# Patient Record
Sex: Female | Born: 1960 | Race: White | Hispanic: No | Marital: Married | Smoking: Never smoker
Health system: Southern US, Community
[De-identification: ages and names within clinical notes are randomized; demographics above are authoritative.]

## PROBLEM LIST (undated history)

## (undated) DIAGNOSIS — K219 Gastro-esophageal reflux disease without esophagitis: Secondary | ICD-10-CM

## (undated) DIAGNOSIS — K8050 Calculus of bile duct without cholangitis or cholecystitis without obstruction: Secondary | ICD-10-CM

## (undated) DIAGNOSIS — T7840XA Allergy, unspecified, initial encounter: Secondary | ICD-10-CM

## (undated) HISTORY — DX: Gastro-esophageal reflux disease without esophagitis: K21.9

## (undated) HISTORY — DX: Allergy, unspecified, initial encounter: T78.40XA

---

## 1993-09-11 HISTORY — PX: TMJ ARTHROPLASTY: SHX1066

## 2016-02-22 ENCOUNTER — Encounter (INDEPENDENT_AMBULATORY_CARE_PROVIDER_SITE_OTHER): Payer: Self-pay

## 2016-02-22 ENCOUNTER — Encounter: Payer: Self-pay | Admitting: Family Medicine

## 2016-02-22 ENCOUNTER — Ambulatory Visit (INDEPENDENT_AMBULATORY_CARE_PROVIDER_SITE_OTHER): Payer: BLUE CROSS/BLUE SHIELD | Admitting: Family Medicine

## 2016-02-22 VITALS — BP 98/66 | HR 73 | Temp 97.1°F | Ht 65.0 in | Wt 100.0 lb

## 2016-02-22 DIAGNOSIS — R0689 Other abnormalities of breathing: Secondary | ICD-10-CM

## 2016-02-22 DIAGNOSIS — J3089 Other allergic rhinitis: Secondary | ICD-10-CM | POA: Diagnosis not present

## 2016-02-22 DIAGNOSIS — H8110 Benign paroxysmal vertigo, unspecified ear: Secondary | ICD-10-CM

## 2016-02-22 DIAGNOSIS — R0600 Dyspnea, unspecified: Secondary | ICD-10-CM

## 2016-02-22 DIAGNOSIS — R42 Dizziness and giddiness: Secondary | ICD-10-CM

## 2016-02-22 LAB — MICROSCOPIC EXAMINATION

## 2016-02-22 LAB — URINALYSIS, COMPLETE
BILIRUBIN UA: NEGATIVE
GLUCOSE, UA: NEGATIVE
Nitrite, UA: NEGATIVE
Protein, UA: NEGATIVE
Specific Gravity, UA: >1.03 — ABNORMAL HIGH (ref 1.005–1.030)
UUROB: 0.2 mg/dL (ref 0.2–1.0)
pH, UA: 5.5 (ref 5.0–7.5)

## 2016-02-22 NOTE — Progress Notes (Signed)
Subjective:  Patient ID: Joyce Hopkins, female    DOB: Jan 28, 1961  Age: 55 y.o. MRN: [id number]  CC: Establish Care   HPI Joyce Hopkins presents for Concerns about recent episodes of vertigo. Several days ago she rolled over in bed during the night and the room started spinning. It only lasts a few moments then things were back to normal and she went back to sleep. Unfortunately a few days ago it happened again. Then 2 days ago she noticed an episode through the day. She turned her head suddenly upward and then backed downward and the room started spinning and she was off balance and unsteady for several minutes. After that she felt somewhat off balance the rest of the day like she had to step very carefully with every movement for the rest of that day. There was some mild nausea associated as well. She has no previous history of vertigo. She does use hyoscyamine periodically for irritable bowel symptoms. She has been prescribed a small amount of alprazolam periodically for anxiety when she is overwhelmed.  GAD 7 : Generalized Anxiety Score 02/22/2016  Nervous, Anxious, on Edge 1  Control/stop worrying 1  Worry too much - different things 1  Trouble relaxing 2  Restless 1  Easily annoyed or irritable 1  Afraid - awful might happen 0  Total GAD 7 Score 7  Anxiety Difficulty Not difficult at all     Depression screen [facility] 2/9 02/22/2016  Decreased Interest 0  Down, Depressed, Hopeless 0  PHQ - 2 Score 0    Patient is concerned that allergy may be playing a role in this and that she was mowing the lawn a few days ago and noticed that her eyes started watering and itching. This was the day after the most recent episode. She has multiple on frequently before that with no similar reaction. However, she has a history of childhood asthma and has used an inhaler with some relief in the past as an adult. History Joyce Hopkins has a past medical history of Allergy.   She has past surgical history that includes  TMJ Arthroplasty.   Her family history includes Heart disease in her father.She reports that she has never smoked. She does not have any smokeless tobacco history on file. She reports that she drinks alcohol. She reports that she does not use illicit drugs.  No current outpatient prescriptions on file prior to visit.   No current facility-administered medications on file prior to visit.    ROS Review of Systems  Constitutional: Negative for fever, activity change and appetite change.  HENT: Negative for congestion, rhinorrhea and sore throat.   Eyes: Negative for visual disturbance.  Respiratory: Negative for cough and shortness of breath.   Cardiovascular: Negative for chest pain and palpitations.  Gastrointestinal: Negative for nausea, abdominal pain and diarrhea.  Genitourinary: Negative for dysuria.  Musculoskeletal: Negative for myalgias and arthralgias.    Objective:  BP 98/66 mmHg  Pulse 73  Temp(Src) 97.1 F (36.2 C) (Oral)  Ht '5\' 5"'  (1.651 m)  Wt 100 lb (45.36 kg)  BMI 16.64 kg/m2  SpO2 100%  Physical Exam  Constitutional: She is oriented to person, place, and time. She appears well-developed and well-nourished. No distress.  HENT:  Head: Normocephalic and atraumatic.  Right Ear: External ear normal.  Left Ear: External ear normal.  Nose: Nose normal.  Mouth/Throat: Oropharynx is clear and moist.  Eyes: Conjunctivae and EOM are normal. Pupils are equal, round, and reactive to light.  Neck: Normal range of motion. Neck supple. No thyromegaly present.  Cardiovascular: Normal rate, regular rhythm and normal heart sounds.   No murmur heard. Pulmonary/Chest: Effort normal and breath sounds normal. No respiratory distress. She has no wheezes. She has no rales.  Abdominal: Soft. Bowel sounds are normal. She exhibits no distension. There is no tenderness.  Lymphadenopathy:    She has no cervical adenopathy.  Neurological: She is alert and oriented to person, place, and  time. She has normal reflexes.  Skin: Skin is warm and dry.  Psychiatric: She has a normal mood and affect. Her behavior is normal. Judgment and thought content normal.    Assessment & Plan:   Joyce Hopkins was seen today for establish care.  Diagnoses and all orders for this visit:  Dyspnea and respiratory abnormality -     CBC with Differential/Platelet -     CMP14+EGFR -     Lipid panel -     PR BREATHING CAPACITY TEST -     TSH + free T4 -     Urinalysis, Complete -     Sedimentation rate  Other allergic rhinitis -     CBC with Differential/Platelet -     Sedimentation rate  Benign paroxysmal positional vertigo, unspecified laterality -     CBC with Differential/Platelet -     CMP14+EGFR -     Lipid panel -     TSH + free T4 -     Urinalysis, Complete -     Sedimentation rate  Vertigo -     Ambulatory referral to Neurology  Other orders -     Microscopic Examination   I am having Joyce Hopkins maintain her ALPRAZolam, hyoscyamine, and albuterol.  Meds ordered this encounter  Medications  . ALPRAZolam (XANAX) 0.5 MG tablet    Sig: Take 0.5 mg by mouth at bedtime as needed.   . hyoscyamine (LEVSIN, ANASPAZ) 0.125 MG tablet    Sig: Take 0.125 mg by mouth every 6 (six) hours as needed.   Marland Kitchen albuterol (PROAIR HFA) 108 (90 Base) MCG/ACT inhaler    Sig: Inhale 2 puffs into the lungs every 6 (six) hours as needed.    Pulmonary function reveals FEV1 to FVC ratio of 69% which is at 80% of expected.  Follow-up: Return in about 6 months (around 08/23/2016).  Joyce Hopkins, M.D.

## 2016-02-23 LAB — CBC WITH DIFFERENTIAL/PLATELET
BASOS ABS: 0 10*3/uL (ref 0.0–0.2)
Basos: 1 %
EOS (ABSOLUTE): 0.4 10*3/uL (ref 0.0–0.4)
Eos: 6 %
Hematocrit: 42 % (ref 34.0–46.6)
Hemoglobin: 13.3 g/dL (ref 11.1–15.9)
IMMATURE GRANS (ABS): 0 10*3/uL (ref 0.0–0.1)
Immature Granulocytes: 0 %
LYMPHS ABS: 2.2 10*3/uL (ref 0.7–3.1)
LYMPHS: 33 %
MCH: 28.8 pg (ref 26.6–33.0)
MCHC: 31.7 g/dL (ref 31.5–35.7)
MCV: 91 fL (ref 79–97)
Monocytes Absolute: 0.6 10*3/uL (ref 0.1–0.9)
Monocytes: 8 %
NEUTROS ABS: 3.6 10*3/uL (ref 1.4–7.0)
Neutrophils: 52 %
PLATELETS: 227 10*3/uL (ref 150–379)
RBC: 4.62 x10E6/uL (ref 3.77–5.28)
RDW: 13.6 % (ref 12.3–15.4)
WBC: 6.7 10*3/uL (ref 3.4–10.8)

## 2016-02-23 LAB — CMP14+EGFR
A/G RATIO: 1.6 (ref 1.2–2.2)
ALBUMIN: 4.7 g/dL (ref 3.5–5.5)
ALT: 26 IU/L (ref 0–32)
AST: 24 IU/L (ref 0–40)
Alkaline Phosphatase: 85 IU/L (ref 39–117)
BUN / CREAT RATIO: 19 (ref 9–23)
BUN: 14 mg/dL (ref 6–24)
Bilirubin Total: <0.2 mg/dL (ref 0.0–1.2)
CO2: 25 mmol/L (ref 18–29)
Calcium: 9.7 mg/dL (ref 8.7–10.2)
Chloride: 101 mmol/L (ref 96–106)
Creatinine, Ser: 0.72 mg/dL (ref 0.57–1.00)
GFR calc non Af Amer: 95 mL/min/{1.73_m2} (ref >59)
GFR, EST AFRICAN AMERICAN: 109 mL/min/{1.73_m2} (ref >59)
GLUCOSE: 86 mg/dL (ref 65–99)
Globulin, Total: 3 g/dL (ref 1.5–4.5)
POTASSIUM: 4.4 mmol/L (ref 3.5–5.2)
Sodium: 141 mmol/L (ref 134–144)
TOTAL PROTEIN: 7.7 g/dL (ref 6.0–8.5)

## 2016-02-23 LAB — SEDIMENTATION RATE: SED RATE: 2 mm/h (ref 0–40)

## 2016-02-23 LAB — LIPID PANEL
Chol/HDL Ratio: 2.8 ratio units (ref 0.0–4.4)
Cholesterol, Total: 215 mg/dL — ABNORMAL HIGH (ref 100–199)
HDL: 77 mg/dL (ref >39)
LDL Calculated: 109 mg/dL — ABNORMAL HIGH (ref 0–99)
TRIGLYCERIDES: 145 mg/dL (ref 0–149)
VLDL Cholesterol Cal: 29 mg/dL (ref 5–40)

## 2016-02-23 LAB — TSH+FREE T4
Free T4: 0.96 ng/dL (ref 0.82–1.77)
TSH: 2.31 u[IU]/mL (ref 0.450–4.500)

## 2016-02-28 ENCOUNTER — Telehealth: Payer: Self-pay | Admitting: Family Medicine

## 2016-03-17 ENCOUNTER — Ambulatory Visit (INDEPENDENT_AMBULATORY_CARE_PROVIDER_SITE_OTHER): Payer: BLUE CROSS/BLUE SHIELD | Admitting: Neurology

## 2016-03-17 ENCOUNTER — Encounter: Payer: Self-pay | Admitting: Neurology

## 2016-03-17 VITALS — Ht 64.0 in | Wt 101.0 lb

## 2016-03-17 DIAGNOSIS — H8110 Benign paroxysmal vertigo, unspecified ear: Secondary | ICD-10-CM

## 2016-03-17 DIAGNOSIS — F411 Generalized anxiety disorder: Secondary | ICD-10-CM

## 2016-03-17 NOTE — Progress Notes (Signed)
[facility]    Provider:  Dr Jaynee Eagles Referring Provider: Claretta Fraise, MD Primary Care Physician:  Claretta Fraise, MD  CC:  dizziness  HPI:  Joyce Hopkins is a 55 y.o. female here as a referral from Dr. Livia Snellen for dizziness. Past medical history of anxiety. Dizziness started May 22nd, she rolled over in bed and the room started spinning, no other inciting event or head trauma but she was having allergy problems. . This worsens her anxiety, makes her anxiety worse during episodes. Happened again a week later also rolling over in bed. Brief.originally resolved right away  but more recently the symptoms continued with a "swimmy head" feeling for a few weeks. She was feeling "swimmy headed" and the next day she looked up and had a really bad room spinning episode again, felt nauseated. Laying still made it better. Head movements made it worse. She started taking allegra and it has helped tremendously and she started this on June 19th. She has not had any more incidents since then. All started in the setting of allergy problems. No diabtes, no hld, no htn, no heart disease, no risk factors for stroke. No other focal neurologic deficits or complaints.   Review of Systems: Patient complains of symptoms per HPI as well as the following symptoms: Dizziness, anxiety, spinning sensation, allergies. Pertinent negatives per HPI. All others negative.   Social History   Social History  . Marital Status: Married    Spouse Name: Lake Bells "wes"  . Number of Children: 2  . Years of Education: 16   Occupational History  . Not on file.   Social History Main Topics  . Smoking status: Never Smoker   . Smokeless tobacco: Not on file  . Alcohol Use: 0.0 oz/week    0 Standard drinks or equivalent per week     Comment: rarely  . Drug Use: No  . Sexual Activity: Not on file   Other Topics Concern  . Not on file   Social History Narrative   Lives with husband   Caffeine use: 1 code per  day   Coffee daily (very little)    Family History  Problem Relation Age of Onset  . Heart disease Father   . Stroke Neg Hx   . Migraines Neg Hx   . Neuropathy Neg Hx     Past Medical History  Diagnosis Date  . Allergy     Past Surgical History  Procedure Laterality Date  . Tmj arthroplasty  1995    Current Outpatient Prescriptions  Medication Sig Dispense Refill  . albuterol (PROAIR HFA) 108 (90 Base) MCG/ACT inhaler Inhale 2 puffs into the lungs every 6 (six) hours as needed.     . ALPRAZolam (XANAX) 0.5 MG tablet Take 0.5 mg by mouth at bedtime as needed.     . hyoscyamine (LEVSIN, ANASPAZ) 0.125 MG tablet Take 0.125 mg by mouth every 6 (six) hours as needed.      No current facility-administered medications for this visit.    Allergies as of 03/17/2016 - Review Complete 03/17/2016  Allergen Reaction Noted  . Acetaminophen Anaphylaxis 02/22/2016  . Levofloxacin  02/22/2016    Vitals: Ht 5\' 4"  (1.626 m)  Wt 101 lb (45.813 kg)  BMI 17.33 kg/m2 Last Weight:  Wt Readings from Last 1 Encounters:  03/17/16 101 lb (45.813 kg)   Last Height:   Ht Readings from Last 1 Encounters:  03/17/16 5\' 4"  (1.626 m)   Physical exam: Exam: Gen: NAD, conversant, well nourised,  obese, well groomed                     CV: RRR, no MRG. No Carotid Bruits. No peripheral edema, warm, nontender Eyes: Conjunctivae clear without exudates or hemorrhage  Neuro: Detailed Neurologic Exam  Speech:    Speech is normal; fluent and spontaneous with normal comprehension.  Cognition:    The patient is oriented to person, place, and time;     recent and remote memory intact;     language fluent;     normal attention, concentration,     fund of knowledge Cranial Nerves:    The pupils are equal, round, and reactive to light. The fundi are normal and spontaneous venous pulsations are present. Visual fields are full to finger confrontation. Extraocular movements are intact. Trigeminal  sensation is intact and the muscles of mastication are normal. The face is symmetric. The palate elevates in the midline. Hearing intact. Voice is normal. Shoulder shrug is normal. The tongue has normal motion without fasciculations.   Coordination:    Normal finger to nose and heel to shin. Normal rapid alternating movements.   Gait:    Heel-toe and tandem gait are normal.   Motor Observation:    No asymmetry, no atrophy, and no involuntary movements noted. Tone:    Normal muscle tone.    Posture:    Posture is normal. normal erect    Strength:    Strength is V/V in the upper and lower limbs.      Sensation: intact to LT     Reflex Exam:  DTR's:    Deep tendon reflexes in the upper and lower extremities are normal bilaterally.   Toes:    The toes are downgoing bilaterally.   Clonus:    Clonus is absent.   Diz-Hallpike negative  Assessment/Plan:  55 year old with benign positional peripheral vertigo resolved with antihistamine. Diz-hallpike negative. Patient denies any risk factors for stroke. Had a discussion about BPPV, etiology, ways to treat. If symptoms occur again would refer to vestibular rehab for epley maneuver instructions and other therapy, order MRI of the brain for other etiology and prescribe meclizine. Reassured patient.  BPPV: Continue antihistamine as needed. If BPPV symptoms reoccur, order vestibular therapy for Epley Maneuver instructions and other compensatory therapy Anxiety: If anxiety is a factor, try a Xanax for episodes as this is a great vestibular suppressant for the dizziness/vertigo and also will help for the episodic anxiety.  Sarina Ill, MD  [facility] [address] [place], [place] [address]  Phone [phone] Fax [phone]

## 2016-03-17 NOTE — Patient Instructions (Signed)

## 2016-03-19 ENCOUNTER — Encounter: Payer: Self-pay | Admitting: Neurology

## 2016-03-19 DIAGNOSIS — H8110 Benign paroxysmal vertigo, unspecified ear: Secondary | ICD-10-CM | POA: Insufficient documentation

## 2016-03-19 DIAGNOSIS — F411 Generalized anxiety disorder: Secondary | ICD-10-CM | POA: Insufficient documentation

## 2016-04-04 DIAGNOSIS — H5203 Hypermetropia, bilateral: Secondary | ICD-10-CM | POA: Diagnosis not present

## 2016-04-04 DIAGNOSIS — H524 Presbyopia: Secondary | ICD-10-CM | POA: Diagnosis not present

## 2016-04-04 DIAGNOSIS — H52222 Regular astigmatism, left eye: Secondary | ICD-10-CM | POA: Diagnosis not present

## 2016-06-24 ENCOUNTER — Ambulatory Visit (INDEPENDENT_AMBULATORY_CARE_PROVIDER_SITE_OTHER): Payer: 59

## 2016-06-24 DIAGNOSIS — Z23 Encounter for immunization: Secondary | ICD-10-CM | POA: Diagnosis not present

## 2016-07-10 ENCOUNTER — Other Ambulatory Visit: Payer: Self-pay | Admitting: Family Medicine

## 2016-07-10 NOTE — Telephone Encounter (Signed)
Appointment scheduled for 07/14/16 for followup

## 2016-07-14 ENCOUNTER — Ambulatory Visit: Payer: 59 | Admitting: Family Medicine

## 2016-07-17 ENCOUNTER — Ambulatory Visit (INDEPENDENT_AMBULATORY_CARE_PROVIDER_SITE_OTHER): Payer: 59 | Admitting: Family Medicine

## 2016-07-17 ENCOUNTER — Encounter: Payer: Self-pay | Admitting: Family Medicine

## 2016-07-17 VITALS — BP 106/64 | HR 70 | Temp 97.0°F | Ht 64.0 in | Wt 103.0 lb

## 2016-07-17 DIAGNOSIS — R0689 Other abnormalities of breathing: Secondary | ICD-10-CM

## 2016-07-17 DIAGNOSIS — R0600 Dyspnea, unspecified: Secondary | ICD-10-CM | POA: Diagnosis not present

## 2016-07-17 DIAGNOSIS — J4520 Mild intermittent asthma, uncomplicated: Secondary | ICD-10-CM | POA: Diagnosis not present

## 2016-07-17 DIAGNOSIS — F411 Generalized anxiety disorder: Secondary | ICD-10-CM | POA: Diagnosis not present

## 2016-07-17 MED ORDER — ALPRAZOLAM 0.5 MG PO TABS: 0.5000 mg | ORAL_TABLET | Freq: Every evening | ORAL | 2 refills | Status: DC | PRN

## 2016-07-17 MED ORDER — ALBUTEROL SULFATE HFA 108 (90 BASE) MCG/ACT IN AERS: 2.0000 | INHALATION_SPRAY | Freq: Four times a day (QID) | RESPIRATORY_TRACT | 11 refills | Status: DC | PRN

## 2016-07-17 NOTE — Progress Notes (Signed)
Subjective:  Patient ID: Joyce Hopkins, female    DOB: 03-16-1961  Age: 55 y.o. 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 Symptoms have been under good control recently. She does need refill on her Xanax. Additionally she has some intermittent dyspnea with exertion. She has no inhaler that gives her good relief. Symptoms seem to have occurred a bit more recently. She is having to use the inhaler more. However it is not interfering with her daily routine as a rule.  GAD 7 : Generalized Anxiety Score 07/17/2016 02/22/2016  Nervous, Anxious, on Edge 1 1  Control/stop worrying 0 1  Worry too much - different things 1 1  Trouble relaxing 0 2  Restless 0 1  Easily annoyed or irritable 0 1  Afraid - awful might happen 0 0  Total GAD 7 Score 2 7  Anxiety Difficulty Not difficult at all Not difficult at all     History Joyce Hopkins has a past medical history of Allergy.   She has a past surgical history that includes TMJ Arthroplasty (1995).   Her family history includes Heart disease in her father.She reports that she has never smoked. She does not have any smokeless tobacco history on file. She reports that she drinks alcohol. She reports that she does not use drugs.    Medication List       Accurate as of 07/17/16 11:59 PM. Always use your most recent med list.          albuterol 108 (90 Base) MCG/ACT inhaler Commonly known as:  PROAIR HFA Inhale 2 puffs into the lungs every 6 (six) hours as needed.   ALPRAZolam 0.5 MG tablet Commonly known as:  XANAX Take 1 tablet (0.5 mg total) by mouth at bedtime as needed for anxiety or sleep.   hyoscyamine 0.125 MG tablet Commonly known as:  LEVSIN, ANASPAZ Take 0.125 mg by mouth every 6 (six) hours as needed.        ROS Review of Systems  Constitutional: Negative for activity change, appetite change and fever.  HENT: Negative for congestion, rhinorrhea and  sore throat.   Eyes: Negative for visual disturbance.  Respiratory: Negative for cough and shortness of breath.   Cardiovascular: Negative for chest pain and palpitations.  Gastrointestinal: Negative for abdominal pain, diarrhea and nausea.  Genitourinary: Negative for dysuria.  Musculoskeletal: Negative for arthralgias and myalgias.    Objective:  BP 106/64   Pulse 70   Temp 97 F (36.1 C) (Oral)   Ht 5\' 4"  (1.626 m)   Wt 103 lb (46.7 kg)   BMI 17.68 kg/m   BP Readings from Last 3 Encounters:  07/17/16 106/64  02/22/16 98/66    Wt Readings from Last 3 Encounters:  07/17/16 103 lb (46.7 kg)  03/17/16 101 lb (45.8 kg)  02/22/16 100 lb (45.4 kg)     Physical Exam  Constitutional: She is oriented to person, place, and time. She appears well-developed and well-nourished. No distress.  HENT:  Head: Normocephalic and atraumatic.  Right Ear: External ear normal.  Left Ear: External ear normal.  Nose: Nose normal.  Mouth/Throat: Oropharynx is clear and moist.  Eyes: Conjunctivae and EOM are normal. Pupils are equal, round, and reactive to light.  Neck: Normal range of motion. Neck supple. No thyromegaly present.  Cardiovascular: Normal rate, regular rhythm and normal heart sounds.   No murmur heard. Pulmonary/Chest: Effort normal and breath sounds normal. No  respiratory distress. She has no wheezes. She has no rales.  Abdominal: Soft. Bowel sounds are normal. She exhibits no distension. There is no tenderness.  Lymphadenopathy:    She has no cervical adenopathy.  Neurological: She is alert and oriented to person, place, and time. She has normal reflexes.  Skin: Skin is warm and dry.  Psychiatric: She has a normal mood and affect. Her behavior is normal. Judgment and thought content normal.       Patient was never admitted.  Assessment & Plan:   Crisol was seen today for medication refill.  Diagnoses and all orders for this visit:  Anxiety state  Mild  intermittent asthma without complication  Dyspnea and respiratory abnormality -     albuterol (PROAIR HFA) 108 (90 Base) MCG/ACT inhaler; Inhale 2 puffs into the lungs every 6 (six) hours as needed.  Other orders -     ALPRAZolam (XANAX) 0.5 MG tablet; Take 1 tablet (0.5 mg total) by mouth at bedtime as needed for anxiety or sleep.   I have changed Ms. Osbourne's ALPRAZolam. I am also having her maintain her hyoscyamine and albuterol.  Meds ordered this encounter  Medications  . ALPRAZolam (XANAX) 0.5 MG tablet    Sig: Take 1 tablet (0.5 mg total) by mouth at bedtime as needed for anxiety or sleep.    Dispense:  30 tablet    Refill:  2  . albuterol (PROAIR HFA) 108 (90 Base) MCG/ACT inhaler    Sig: Inhale 2 puffs into the lungs every 6 (six) hours as needed.    Dispense:  1 Inhaler    Refill:  11     Follow-up: Return in about 6 months (around 01/14/2017).  Claretta Fraise, M.D.

## 2016-07-19 ENCOUNTER — Telehealth: Payer: Self-pay | Admitting: Family Medicine

## 2016-08-06 ENCOUNTER — Encounter: Payer: Self-pay | Admitting: Family Medicine

## 2016-08-23 ENCOUNTER — Ambulatory Visit: Payer: BLUE CROSS/BLUE SHIELD | Admitting: Family Medicine

## 2017-01-30 DIAGNOSIS — H04123 Dry eye syndrome of bilateral lacrimal glands: Secondary | ICD-10-CM | POA: Diagnosis not present

## 2017-04-02 ENCOUNTER — Encounter: Payer: Self-pay | Admitting: Family Medicine

## 2017-04-02 ENCOUNTER — Ambulatory Visit (INDEPENDENT_AMBULATORY_CARE_PROVIDER_SITE_OTHER): Payer: 59 | Admitting: Family Medicine

## 2017-04-02 VITALS — BP 89/60 | HR 65 | Temp 97.7°F | Ht 64.0 in | Wt 102.0 lb

## 2017-04-02 DIAGNOSIS — K2100 Gastro-esophageal reflux disease with esophagitis, without bleeding: Secondary | ICD-10-CM

## 2017-04-02 DIAGNOSIS — F5101 Primary insomnia: Secondary | ICD-10-CM

## 2017-04-02 DIAGNOSIS — R0600 Dyspnea, unspecified: Secondary | ICD-10-CM

## 2017-04-02 DIAGNOSIS — F411 Generalized anxiety disorder: Secondary | ICD-10-CM

## 2017-04-02 DIAGNOSIS — R0689 Other abnormalities of breathing: Secondary | ICD-10-CM | POA: Diagnosis not present

## 2017-04-02 MED ORDER — HYOSCYAMINE SULFATE 0.125 MG PO TABS: 0.1250 mg | ORAL_TABLET | Freq: Four times a day (QID) | ORAL | 5 refills | Status: DC | PRN

## 2017-04-02 MED ORDER — ALPRAZOLAM 0.5 MG PO TABS: 0.5000 mg | ORAL_TABLET | Freq: Every evening | ORAL | 2 refills | Status: DC | PRN

## 2017-04-02 MED ORDER — ALBUTEROL SULFATE HFA 108 (90 BASE) MCG/ACT IN AERS: 2.0000 | INHALATION_SPRAY | Freq: Four times a day (QID) | RESPIRATORY_TRACT | 11 refills | Status: DC | PRN

## 2017-04-02 MED ORDER — PANTOPRAZOLE SODIUM 40 MG PO TBEC: 40.0000 mg | DELAYED_RELEASE_TABLET | Freq: Every day | ORAL | 11 refills | Status: DC

## 2017-04-02 MED ORDER — TRAZODONE HCL 150 MG PO TABS: ORAL_TABLET | ORAL | 5 refills | Status: DC

## 2017-04-02 NOTE — Patient Instructions (Signed)

## 2017-04-02 NOTE — Progress Notes (Signed)
Subjective:  Patient ID: Joyce Hopkins, female    DOB: 04-15-1961  Age: 56 y.o. MRN: [id number]  CC: Gastroesophageal Reflux (pt here today c/o increased frequency of heartburn/indigestion which in the past she has been able to control with her diet. She has also been having trouble sleeping which has been going on for years but recently has started to interfere with waking up and getting ready for work.)   HPI Joyce Hopkins presents for Patient reports a week ago having a 7 hour episode of heartburn and it kept her awake all night. She started taking Prilosec over-the-counter with some success. It has gotten much better since then. She has a history of multiple food intolerances including eggs lettuce avocado and banana. Her first episode was about 25 years ago. It was relieved by discontinuing these foods. About 6 years ago she was using half-and-half in her coffee. She switched on milk and that relieved some symptoms she was having then. Currently just jostling on a mower when she is riding causes upset stomach and heartburn. This led up to the recent episode lasting 7 hours but there have been several other episodes leading up to that.  Additionally the patient reports that she lays awake at night with her mind going on and on and on. She does get anxious during the day too. Xanax at bedtime sometimes helps sometimes not.  She is not having to use her inhaler very often asthma has been quiescent for some time. However her inhalers about to expire and needs to be renewed. The same is true of her Levsinex. No GI distressed other than heartburn. She's not having the irritable bowel symptoms but would like to have that renewed for eventual occasional problems.  Depression screen [facility] 2/9 04/02/2017 07/17/2016 07/17/2016  Decreased Interest 0 0 0  Down, Depressed, Hopeless 0 0 0  PHQ - 2 Score 0 0 0  Altered sleeping - 0 -  Tired, decreased energy - 0 -  Change in appetite - 0 -  Feeling bad or failure  about yourself  - 0 -  Trouble concentrating - 0 -  Moving slowly or fidgety/restless - 0 -  Suicidal thoughts - 0 -  PHQ-9 Score - 0 -    History Joyce Hopkins has a past medical history of Allergy.   She has a past surgical history that includes TMJ Arthroplasty (1995).   Her family history includes Heart disease in her father.She reports that she has never smoked. She has never used smokeless tobacco. She reports that she drinks alcohol. She reports that she does not use drugs.    ROS Review of Systems  Constitutional: Negative for activity change, appetite change and fever.  HENT: Negative for congestion, rhinorrhea and sore throat.   Eyes: Negative for visual disturbance.  Respiratory: Negative for cough and shortness of breath.   Cardiovascular: Negative for chest pain and palpitations.  Gastrointestinal: Positive for abdominal pain. Negative for diarrhea and nausea.  Genitourinary: Negative for dysuria.  Musculoskeletal: Negative for arthralgias and myalgias.  Psychiatric/Behavioral: Positive for sleep disturbance. The patient is nervous/anxious.     Objective:  BP (!) 89/60   Pulse 65   Temp 97.7 F (36.5 C) (Oral)   Ht 5\' 4"  (1.626 m)   Wt 102 lb (46.3 kg)   BMI 17.51 kg/m   BP Readings from Last 3 Encounters:  04/02/17 (!) 89/60  07/17/16 106/64  02/22/16 98/66    Wt Readings from Last 3 Encounters:  04/02/17 102  lb (46.3 kg)  07/17/16 103 lb (46.7 kg)  03/17/16 101 lb (45.8 kg)     Physical Exam  Constitutional: She is oriented to person, place, and time. She appears well-developed and well-nourished. No distress.  HENT:  Head: Normocephalic and atraumatic.  Right Ear: External ear normal.  Left Ear: External ear normal.  Nose: Nose normal.  Mouth/Throat: Oropharynx is clear and moist.  Eyes: Pupils are equal, round, and reactive to light. Conjunctivae and EOM are normal.  Neck: Normal range of motion. Neck supple. No thyromegaly present.    Cardiovascular: Normal rate, regular rhythm and normal heart sounds.   No murmur heard. Pulmonary/Chest: Effort normal and breath sounds normal. No respiratory distress. She has no wheezes. She has no rales.  Abdominal: Soft. Bowel sounds are normal. She exhibits no distension. There is no tenderness.  Lymphadenopathy:    She has no cervical adenopathy.  Neurological: She is alert and oriented to person, place, and time. She has normal reflexes.  Skin: Skin is warm and dry.  Psychiatric: She has a normal mood and affect. Her behavior is normal. Judgment and thought content normal.      Assessment & Plan:   Joyce Hopkins was seen today for gastroesophageal reflux.  Diagnoses and all orders for this visit:  Primary insomnia -     traZODone (DESYREL) 150 MG tablet; Use from 1/3 to 1 tablet nightly as needed for sleep.  Gastroesophageal reflux disease with esophagitis -     pantoprazole (PROTONIX) 40 MG tablet; Take 1 tablet (40 mg total) by mouth daily. For stomach  Dyspnea and respiratory abnormality -     albuterol (PROAIR HFA) 108 (90 Base) MCG/ACT inhaler; Inhale 2 puffs into the lungs every 6 (six) hours as needed.  GAD (generalized anxiety disorder) -     ALPRAZolam (XANAX) 0.5 MG tablet; Take 1 tablet (0.5 mg total) by mouth at bedtime as needed for anxiety or sleep.  Other orders -     hyoscyamine (LEVSIN, ANASPAZ) 0.125 MG tablet; Take 1 tablet (0.125 mg total) by mouth every 6 (six) hours as needed.       I have changed Joyce Hopkins's hyoscyamine. I am also having her start on traZODone and pantoprazole. Additionally, I am having her maintain her ALPRAZolam and albuterol.  Allergies as of 04/02/2017      Reactions   Acetaminophen Anaphylaxis   Wheezing/hives   Levofloxacin    Muscle spasms      Medication List       Accurate as of 04/02/17 11:37 AM. Always use your most recent med list.          albuterol 108 (90 Base) MCG/ACT inhaler Commonly known as:  PROAIR  HFA Inhale 2 puffs into the lungs every 6 (six) hours as needed.   ALPRAZolam 0.5 MG tablet Commonly known as:  XANAX Take 1 tablet (0.5 mg total) by mouth at bedtime as needed for anxiety or sleep.   hyoscyamine 0.125 MG tablet Commonly known as:  LEVSIN, ANASPAZ Take 1 tablet (0.125 mg total) by mouth every 6 (six) hours as needed.   pantoprazole 40 MG tablet Commonly known as:  PROTONIX Take 1 tablet (40 mg total) by mouth daily. For stomach   traZODone 150 MG tablet Commonly known as:  DESYREL Use from 1/3 to 1 tablet nightly as needed for sleep.        Follow-up: Return in about 1 month (around 05/03/2017) for Wellness.  Claretta Fraise, M.D.

## 2017-04-20 ENCOUNTER — Ambulatory Visit (INDEPENDENT_AMBULATORY_CARE_PROVIDER_SITE_OTHER): Payer: 59 | Admitting: Pediatrics

## 2017-04-20 ENCOUNTER — Ambulatory Visit: Payer: 59 | Admitting: Family Medicine

## 2017-04-20 ENCOUNTER — Encounter: Payer: Self-pay | Admitting: Pediatrics

## 2017-04-20 VITALS — BP 88/63 | HR 97 | Temp 97.4°F | Ht 64.0 in | Wt 100.0 lb

## 2017-04-20 DIAGNOSIS — K219 Gastro-esophageal reflux disease without esophagitis: Secondary | ICD-10-CM | POA: Diagnosis not present

## 2017-04-20 DIAGNOSIS — R1011 Right upper quadrant pain: Secondary | ICD-10-CM

## 2017-04-20 DIAGNOSIS — R1013 Epigastric pain: Secondary | ICD-10-CM

## 2017-04-20 NOTE — Progress Notes (Addendum)
  Subjective:   Patient ID: Joyce Hopkins, female    DOB: 22-Feb-1961, 56 y.o.   MRN: [id number] CC: Heartburn  HPI: Joyce Hopkins is a 56 y.o. female presenting for Heartburn  Here today with her husband  Gets heart burn after eating certain foods, started on PPI 2 weeks ago, helping some. Has been avoiding exacerbating foods, also has helped symptoms  Continues to have a different kind of stomach pain from heart burn, pain across upper abd, from RUQ to epigastric area, sometimes lasting for hours, tends to happen overnight Almost went to the hospital one night pain was so intense Will get nauseous, threw up dinner once in the morning  Has had chills after pain subsides, has never had fevers Has had 2-3 all night episodes in the last 2 weeks Last episode was last night so she came in today Has happened before in the day a few weeks ago  Doesn't have stomach pain or symptoms in between episodes Says is habitual about her eating, doesn't think ate anything different than usual Had salmon, fried okra and squash last night Appetite has been fine, weight has been stable for months  Has had these episodes of stomach pain since May  Regular stooling, no blood in stool Last colonoscopy about 6 yrs, told ok for ten years  Had EGD 25 yrs ago  Takes ibuprofen irregularly for primarily headaches, last dose 3 pills a couple weeks aog, not every day, sometimes weeks between doses  Drinks 1 drink EtOH about once a week, sometimes less  Relevant past medical, surgical, family and social history reviewed. Allergies and medications reviewed and updated. History  Smoking Status  . Never Smoker  Smokeless Tobacco  . Never Used   ROS: Per HPI   Objective:    BP (!) 88/63   Pulse 97   Temp (!) 97.4 F (36.3 C) (Oral)   Ht '5\' 4"'$  (1.626 m)   Wt 100 lb (45.4 kg)   BMI 17.16 kg/m   Wt Readings from Last 3 Encounters:  04/20/17 100 lb (45.4 kg)  04/02/17 102 lb (46.3 kg)  07/17/16 103 lb (46.7  kg)    Gen: NAD, alert, thin, cooperative with exam, NCAT EYES: EOMI, no conjunctival injection, or no icterus ENT:  TMs pearly gray b/l, OP without erythema LYMPH: no cervical LAD CV: NRRR, normal S1/S2, no murmur, distal pulses 2+ b/l Resp: CTABL, no wheezes, normal WOB Abd: +BS, soft, ttp RUQ and epigastric area, neg murphys sign, ND. no guarding or organomegaly Ext: No edema, warm Neuro: Alert and oriented Skin: no rash on visible skin  Assessment & Plan:  Karson was seen today for heartburn.  Diagnoses and all orders for this visit:  Right upper quadrant abdominal pain Unclear etiology, will get labs, [place], refer to GI No EtOH, NSAIDs for now, pt using minimally Avoid fatty foods Return precautions discussed Any fevers needs to be seen -     Lipase -     CMP14+EGFR -     CBC with Differential/Platelet -     TSH -     US Abdomen Complete; Future  Epigastric pain -     Ambulatory referral to Gastroenterology  Gastroesophageal reflux disease, esophagitis presence not specified Cont PPI, avoid exacerbating foods  Follow up plan: 2 weeks [name], MD [place]

## 2017-04-21 ENCOUNTER — Other Ambulatory Visit: Payer: Self-pay | Admitting: Pediatrics

## 2017-04-21 DIAGNOSIS — R7401 Elevation of levels of liver transaminase levels: Secondary | ICD-10-CM

## 2017-04-21 DIAGNOSIS — R740 Nonspecific elevation of levels of transaminase and lactic acid dehydrogenase [LDH]: Principal | ICD-10-CM

## 2017-04-21 LAB — CMP14+EGFR
ALT: 544 [IU]/L (ref 0–32)
AST: 671 [IU]/L (ref 0–40)
Albumin/Globulin Ratio: 1.6 (ref 1.2–2.2)
Albumin: 4.6 g/dL (ref 3.5–5.5)
Alkaline Phosphatase: 256 [IU]/L — ABNORMAL HIGH (ref 39–117)
BUN/Creatinine Ratio: 14 (ref 9–23)
BUN: 11 mg/dL (ref 6–24)
Bilirubin Total: 1 mg/dL (ref 0.0–1.2)
CO2: 23 mmol/L (ref 20–29)
Calcium: 9.5 mg/dL (ref 8.7–10.2)
Chloride: 99 mmol/L (ref 96–106)
Creatinine, Ser: 0.78 mg/dL (ref 0.57–1.00)
GFR calc Af Amer: 98 mL/min/{1.73_m2}
GFR calc non Af Amer: 85 mL/min/{1.73_m2}
Globulin, Total: 2.8 g/dL (ref 1.5–4.5)
Glucose: 121 mg/dL — ABNORMAL HIGH (ref 65–99)
Potassium: 4.5 mmol/L (ref 3.5–5.2)
Sodium: 138 mmol/L (ref 134–144)
Total Protein: 7.4 g/dL (ref 6.0–8.5)

## 2017-04-21 LAB — CBC WITH DIFFERENTIAL/PLATELET
Basophils Absolute: 0 10*3/uL (ref 0.0–0.2)
Basos: 0 %
EOS (ABSOLUTE): 0.1 10*3/uL (ref 0.0–0.4)
Eos: 1 %
Hematocrit: 40.4 % (ref 34.0–46.6)
Hemoglobin: 13.2 g/dL (ref 11.1–15.9)
Immature Grans (Abs): 0 10*3/uL (ref 0.0–0.1)
Immature Granulocytes: 0 %
Lymphocytes Absolute: 1 10*3/uL (ref 0.7–3.1)
Lymphs: 11 %
MCH: 29.3 pg (ref 26.6–33.0)
MCHC: 32.7 g/dL (ref 31.5–35.7)
MCV: 90 fL (ref 79–97)
Monocytes Absolute: 0.2 10*3/uL (ref 0.1–0.9)
Monocytes: 2 %
Neutrophils Absolute: 8.6 10*3/uL — ABNORMAL HIGH (ref 1.4–7.0)
Neutrophils: 86 %
Platelets: 237 10*3/uL (ref 150–379)
RBC: 4.5 x10E6/uL (ref 3.77–5.28)
RDW: 14.6 % (ref 12.3–15.4)
WBC: 9.9 10*3/uL (ref 3.4–10.8)

## 2017-04-21 LAB — TSH: TSH: 0.985 u[IU]/mL (ref 0.450–4.500)

## 2017-04-21 LAB — LIPASE: Lipase: 468 U/L — ABNORMAL HIGH (ref 14–72)

## 2017-04-23 ENCOUNTER — Other Ambulatory Visit: Payer: 59

## 2017-04-23 DIAGNOSIS — R740 Nonspecific elevation of levels of transaminase and lactic acid dehydrogenase [LDH]: Secondary | ICD-10-CM | POA: Diagnosis not present

## 2017-04-23 DIAGNOSIS — R7401 Elevation of levels of liver transaminase levels: Secondary | ICD-10-CM

## 2017-04-24 LAB — COMPREHENSIVE METABOLIC PANEL
ALK PHOS: 196 IU/L — AB (ref 39–117)
ALT: 159 IU/L — ABNORMAL HIGH (ref 0–32)
AST: 49 IU/L — AB (ref 0–40)
Albumin/Globulin Ratio: 1.8 (ref 1.2–2.2)
Albumin: 4.4 g/dL (ref 3.5–5.5)
BUN/Creatinine Ratio: 21 (ref 9–23)
BUN: 15 mg/dL (ref 6–24)
Bilirubin Total: 0.2 mg/dL (ref 0.0–1.2)
CO2: 25 mmol/L (ref 20–29)
CREATININE: 0.73 mg/dL (ref 0.57–1.00)
Calcium: 9.4 mg/dL (ref 8.7–10.2)
Chloride: 101 mmol/L (ref 96–106)
GFR calc Af Amer: 106 mL/min/{1.73_m2} (ref >59)
GFR calc non Af Amer: 92 mL/min/{1.73_m2} (ref >59)
Globulin, Total: 2.5 g/dL (ref 1.5–4.5)
Glucose: 67 mg/dL (ref 65–99)
POTASSIUM: 4.1 mmol/L (ref 3.5–5.2)
Sodium: 139 mmol/L (ref 134–144)
Total Protein: 6.9 g/dL (ref 6.0–8.5)

## 2017-04-24 LAB — CBC WITH DIFFERENTIAL
BASOS: 1 %
Basophils Absolute: 0 10*3/uL (ref 0.0–0.2)
EOS (ABSOLUTE): 0.4 10*3/uL (ref 0.0–0.4)
Eos: 6 %
Hematocrit: 36.2 % (ref 34.0–46.6)
Hemoglobin: 12.3 g/dL (ref 11.1–15.9)
IMMATURE GRANS (ABS): 0 10*3/uL (ref 0.0–0.1)
IMMATURE GRANULOCYTES: 0 %
LYMPHS: 39 %
Lymphocytes Absolute: 2.3 10*3/uL (ref 0.7–3.1)
MCH: 30 pg (ref 26.6–33.0)
MCHC: 34 g/dL (ref 31.5–35.7)
MCV: 88 fL (ref 79–97)
Monocytes Absolute: 0.4 10*3/uL (ref 0.1–0.9)
Monocytes: 7 %
NEUTROS PCT: 47 %
Neutrophils Absolute: 2.8 10*3/uL (ref 1.4–7.0)
RBC: 4.1 x10E6/uL (ref 3.77–5.28)
RDW: 14 % (ref 12.3–15.4)
WBC: 6 10*3/uL (ref 3.4–10.8)

## 2017-04-24 LAB — ANA: ANA: NEGATIVE

## 2017-04-24 LAB — HEPATITIS B SURFACE ANTIGEN: HEP B S AG: NEGATIVE

## 2017-04-24 LAB — HEPATITIS C ANTIBODY: HEP C VIRUS AB: 0.1 {s_co_ratio} (ref 0.0–0.9)

## 2017-04-24 LAB — HEPATITIS B CORE ANTIBODY, TOTAL: Hep B Core Total Ab: NEGATIVE

## 2017-04-24 LAB — HEPATITIS B SURFACE ANTIBODY,QUALITATIVE: HEP B SURFACE AB, QUAL: REACTIVE

## 2017-04-25 ENCOUNTER — Encounter: Payer: Self-pay | Admitting: Gastroenterology

## 2017-04-25 ENCOUNTER — Other Ambulatory Visit: Payer: Self-pay | Admitting: Pediatrics

## 2017-04-25 ENCOUNTER — Encounter: Payer: Self-pay | Admitting: Pediatrics

## 2017-04-25 ENCOUNTER — Ambulatory Visit (HOSPITAL_COMMUNITY)
Admission: RE | Admit: 2017-04-25 | Discharge: 2017-04-25 | Disposition: A | Discharge status: Home / Self Care | Payer: 59 | Source: Ambulatory Visit | Attending: Pediatrics | Admitting: Pediatrics

## 2017-04-25 DIAGNOSIS — R1011 Right upper quadrant pain: Secondary | ICD-10-CM

## 2017-04-25 DIAGNOSIS — K828 Other specified diseases of gallbladder: Secondary | ICD-10-CM | POA: Diagnosis not present

## 2017-04-26 ENCOUNTER — Telehealth: Payer: Self-pay

## 2017-04-26 DIAGNOSIS — K859 Acute pancreatitis without necrosis or infection, unspecified: Secondary | ICD-10-CM

## 2017-04-26 NOTE — Telephone Encounter (Signed)
Dr. Evette Doffing called from [facility] to make [place] aware of pt's labs, etc.  Pt has had severe episode of the RUQ pain and it lasted for hours.  Please see labs/ [place] reports.  Dr. Evette Doffing would like to know if there is anything else she should do before pt's appointment here.   She is not scheduled until 06/12/2017 @ 10:30 Am with Neil Crouch, PA.  Magda Paganini, please advise!

## 2017-04-26 NOTE — Telephone Encounter (Signed)
Thank you, MRCP ordered. Spoke with pt, she is aware of new appt date.

## 2017-04-26 NOTE — Telephone Encounter (Signed)
LMOM for pt the new appt time. Will mail letter.

## 2017-04-26 NOTE — Telephone Encounter (Signed)
Reviewed patient's labs dated August 10 and August 13 as well as abdominal ultrasound from yesterday.  She had elevated alkaline phosphatase of 256, AST 671, ALT 544 with a normal bilirubin. Her lipase was over 400. Viral markers negative for acute hepatitis. Repeat LFTs 3 days later with improvement of her alkaline phosphatase, transaminases. Lipase was not repeated.  I would be concerned for possibility of biliary pancreatitis, she could've passed a small stone or sludge.  I would suggest she have MRI abdomen/MRCP to evaluate her pancreas as well as her bile duct for any retained stones. Please ask Dr. Livia Snellen to order this as we have not seen the patient.  CAN WE PLEASE GET HER IN USING AN URGENT SPOT GIVEN URGENT NATURE OF ILLNESS.

## 2017-04-26 NOTE — Telephone Encounter (Signed)
Forwarding the note to Dr. Evette Doffing who called and also referred the pt.  Please note the recommendation from Neil Crouch, [place].  I have moved the pt's OV up to 05/09/2017 @ 11:30 am with Magda Paganini.   Will cancel the previous appt of 06/12/2017.

## 2017-04-26 NOTE — Addendum Note (Signed)
Addended by: Eustaquio Maize on: 04/26/2017 11:50 AM   Modules accepted: Orders

## 2017-05-01 NOTE — Telephone Encounter (Signed)
Please follow up. Appears patient is not scheduled.

## 2017-05-02 ENCOUNTER — Other Ambulatory Visit: Payer: Self-pay | Admitting: Pediatrics

## 2017-05-02 DIAGNOSIS — K861 Other chronic pancreatitis: Secondary | ICD-10-CM

## 2017-05-02 DIAGNOSIS — K859 Acute pancreatitis without necrosis or infection, unspecified: Secondary | ICD-10-CM

## 2017-05-02 NOTE — Telephone Encounter (Signed)
LMOM for a return call from pt.  

## 2017-05-02 NOTE — Telephone Encounter (Signed)
Dr. Evette Doffing, when is pt scheduled for her MRCP?

## 2017-05-02 NOTE — Telephone Encounter (Signed)
PT called and said she has not heard from [facility] and does not know if MRCP has been scheduled. She said they were trying to get it worked out with her insurance.

## 2017-05-02 NOTE — Telephone Encounter (Signed)
Thanks for letting me know. I re-ordered it, spoke with our scheduler who is working on it now.

## 2017-05-04 ENCOUNTER — Encounter: Payer: 59 | Admitting: Family Medicine

## 2017-05-09 ENCOUNTER — Encounter: Payer: Self-pay | Admitting: Gastroenterology

## 2017-05-09 ENCOUNTER — Ambulatory Visit (INDEPENDENT_AMBULATORY_CARE_PROVIDER_SITE_OTHER): Payer: 59 | Admitting: Gastroenterology

## 2017-05-09 DIAGNOSIS — R945 Abnormal results of liver function studies: Secondary | ICD-10-CM | POA: Insufficient documentation

## 2017-05-09 DIAGNOSIS — R7989 Other specified abnormal findings of blood chemistry: Secondary | ICD-10-CM | POA: Insufficient documentation

## 2017-05-09 DIAGNOSIS — K828 Other specified diseases of gallbladder: Secondary | ICD-10-CM | POA: Insufficient documentation

## 2017-05-09 DIAGNOSIS — R1013 Epigastric pain: Secondary | ICD-10-CM | POA: Diagnosis not present

## 2017-05-09 NOTE — Progress Notes (Signed)
CC'ED TO PCP 

## 2017-05-09 NOTE — Progress Notes (Signed)
Primary Care Physician:  Claretta Fraise, MD Referring Provider: Dr. [name] with [facility] Primary Gastroenterologist:  Barney Drain, MD   Chief Complaint  Patient presents with  . Abdominal Pain    mid upper abd; since May  . Gastroesophageal Reflux    Protonix helped some  . Emesis    occ    HPI:  Joyce Hopkins is a 56 y.o. female here At the request of Dr. [name] at [facility]. Her family provider is typically Dr. Livia Snellen but she saw Dr. Evette Doffing for acute symptoms.   Patient reports intermittent epigastric pain since May. Has been in the right upper quadrant somewhat. Symptoms typically occur 1-2 hours after her evening meal. For the most part lasting only 2-3 hours however since August symptoms have been more prolonged and more severe. She used to have 2-3 times per month but recently has been weekly. Cannot determine any type of food triggers. She had a similar episode years ago but did well since then except for typical heartburn. After the pain she tends to get chills but no fever. Occasionally has vomiting with it. No change in her bowel habits. She is regular. No melena rectal bleeding. Denies dysphagia. Last episode of epigastric pain was on August 24 lasting for less than 1 hour. What she felt the symptoms coming on she sat down and rested and applied heat which seemed to help.  Blood work on August 10, the morning after an episode that lasted for nondistended hours showed lipase of 468, total bilirubin 1, alkaline phosphatase 256, AST 671, ALT 544, CBC unremarkable. She had repeat LFTs 3 days later with improvement, alkaline phosphatase down to 196, AST 49, ALT 159. Viral markers negative for hep C. She is immune to hepatitis B.  Abdominal ultrasound 04/25/2017 showed mildly distended gallbladder with sludge. No gallstones seen. No gallbladder [place]a thickening or para cholecystic fluid. No intrahepatic or extrahepatic biliary duct dilation.  Typical reflux  controlled on pantoprazole. She states she had a workup several years ago including EGD. States she was told she had esophageal spasms. She takes Levsin for that as needed.   For decades she's weighed right at 100 pounds, never more than 105 pounds. Recently she's gotten down to 98 pounds because she somewhat scared to eat. Eating bland food.   Last colonoscopy reportedly 6 years ago, advised come back 10 years. Last EGD 25 years ago. She denies frequent NSAID use. Occasional alcohol use.    Current Outpatient Prescriptions  Medication Sig Dispense Refill  . albuterol (PROAIR HFA) 108 (90 Base) MCG/ACT inhaler Inhale 2 puffs into the lungs every 6 (six) hours as needed. 1 Inhaler 11  . ALPRAZolam (XANAX) 0.5 MG tablet Take 1 tablet (0.5 mg total) by mouth at bedtime as needed for anxiety or sleep. 30 tablet 2  . cetirizine (ZYRTEC) 10 MG tablet Take 10 mg by mouth daily.    . hyoscyamine (LEVSIN, ANASPAZ) 0.125 MG tablet Take 1 tablet (0.125 mg total) by mouth every 6 (six) hours as needed. 50 tablet 5  . pantoprazole (PROTONIX) 40 MG tablet Take 1 tablet (40 mg total) by mouth daily. For stomach 30 tablet 11  . traZODone (DESYREL) 150 MG tablet Use from 1/3 to 1 tablet nightly as needed for sleep. 30 tablet 5   No current facility-administered medications for this visit.     Allergies as of 05/09/2017 - Review Complete 05/09/2017  Allergen Reaction Noted  . Acetaminophen Anaphylaxis 02/22/2016  . Levofloxacin  02/22/2016    Past Medical History:  Diagnosis Date  . Allergy   . GERD (gastroesophageal reflux disease)     Past Surgical History:  Procedure Laterality Date  . TMJ ARTHROPLASTY  1995    Family History  Problem Relation Age of Onset  . Heart disease Father   . Stroke Neg Hx   . Migraines Neg Hx   . Neuropathy Neg Hx   . Colon cancer Neg Hx   . Pancreatic cancer Neg Hx   . Celiac disease Neg Hx   . Ulcers Neg Hx   . Inflammatory bowel disease Neg Hx     Social  History   Social History  . Marital status: Married    Spouse name: Lake Bells "wes"  . Number of children: 2  . Years of education: 16   Occupational History  . Not on file.   Social History Main Topics  . Smoking status: Never Smoker  . Smokeless tobacco: Never Used  . Alcohol use 0.0 oz/week     Comment: rarely  . Drug use: No  . Sexual activity: Not on file   Other Topics Concern  . Not on file   Social History Narrative   Lives with husband   Caffeine use: 1 code per day   Coffee daily (very little)      ROS:  General: Negative for anorexia,  fatigue, weakness.See history of present illness Eyes: Negative for vision changes.  ENT: Negative for hoarseness, difficulty swallowing , nasal congestion. CV: Negative for chest pain, angina, palpitations, dyspnea on exertion, peripheral edema.  Respiratory: Negative for dyspnea at rest, dyspnea on exertion, cough, sputum, wheezing.  GI: See history of present illness. GU:  Negative for dysuria, hematuria, urinary incontinence, urinary frequency, nocturnal urination.  MS: Negative for joint pain, low back pain.  Derm: Negative for rash or itching.  Neuro: Negative for weakness, abnormal sensation, seizure, frequent headaches, memory loss, confusion.  Psych: Negative for anxiety, depression, suicidal ideation, hallucinations.  Endo: Negative for unusual weight change.  Heme: Negative for bruising or bleeding. Allergy: Negative for rash or hives.    Physical Examination:  BP 114/72   Pulse 60   Temp (!) 97.3 F (36.3 C) (Oral)   Ht 5\' 4"  (1.626 m)   Wt 98 lb 12.8 oz (44.8 kg)   BMI 16.96 kg/m    General: Well-nourished, well-developed in no acute distress.  Head: Normocephalic, atraumatic.   Eyes: Conjunctiva pink, no icterus. Mouth: Oropharyngeal mucosa moist and pink , no lesions erythema or exudate. Neck: Supple without thyromegaly, masses, or lymphadenopathy.  Lungs: Clear to auscultation bilaterally.  Heart:  Regular rate and rhythm, no murmurs rubs or gallops.  Abdomen: Bowel sounds are normal, nontender, nondistended, no hepatosplenomegaly or masses, no abdominal bruits or    hernia , no rebound or guarding.   Rectal: Not performed Extremities: No lower extremity edema. No clubbing or deformities.  Neuro: Alert and oriented x 4 , grossly normal neurologically.  Skin: Warm and dry, no rash or jaundice.   Psych: Alert and cooperative, normal mood and affect.  Labs: Lab Results  Component Value Date   WBC 6.0 04/23/2017   HGB 12.3 04/23/2017   HCT 36.2 04/23/2017   MCV 88 04/23/2017   PLT 237 04/20/2017   Lab Results  Component Value Date   CREATININE 0.73 04/23/2017   BUN 15 04/23/2017   NA 139 04/23/2017   K 4.1 04/23/2017   CL 101 04/23/2017   CO2 25  04/23/2017   Lab Results  Component Value Date   ALT 159 (H) 04/23/2017   AST 49 (H) 04/23/2017   ALKPHOS 196 (H) 04/23/2017   BILITOT 0.2 04/23/2017   Lab Results  Component Value Date   TSH 0.985 04/20/2017   Lab Results  Component Value Date   LIPASE 468 (H) 04/20/2017   Hep B Core Total Ab: negative Hep B surf Ab: reactive Hep B surf Ag: negative HCV Ab: negative  ANA: negative  04/20/2017: Total bilirubin 1.0, alkaline phosphatase 256, AST 671, ALT 544  Imaging Studies: US Abdomen Limited Ruq  Result Date: 04/25/2017 CLINICAL DATA:  Right upper quadrant pain for 3 weeks EXAM: ULTRASOUND ABDOMEN LIMITED RIGHT UPPER QUADRANT COMPARISON:  None. FINDINGS: Gallbladder: Gallbladder is mildly distended with areas of sludge within the gallbladder. There are no echogenic foci appreciable which move and shadow within the gallbladder as is expected with gallstones. There is no gallbladder Grassi thickening or pericholecystic fluid. No sonographic Murphy sign noted by sonographer. Common bile duct: Diameter: 2 mm. No intrahepatic or extrahepatic biliary duct dilatation. Liver: No focal lesion identified. Within normal limits in  parenchymal echogenicity. IMPRESSION: Gallbladder mildly distended with an mild amount of sludge noted in the gallbladder. No gallstones evident. No gallbladder [place] thickening or pericholecystic fluid. Study otherwise unremarkable. Electronically Signed   By: Lowella Grip III M.D.   On: 04/25/2017 09:11

## 2017-05-09 NOTE — Patient Instructions (Signed)
1. MRI as planned. We will contact you with results and further recommendations.

## 2017-05-09 NOTE — Assessment & Plan Note (Signed)
56 year old female presenting for further evaluation of intermittent epigastric pain associated with abnormal LFTs. She has typical reflux which seems to be separate. This is controlled with PPI which was started recently. Complains of intermittent epigastric pain, typically after a meal and in the evenings. The morning after her most severe episode showed significantly elevated alkaline phosphatase, transaminases, lipase on August 10. Improvement of LFTs 3 days later. Continues to have stuttering symptoms about once per week at this time. Ultrasound showed gallbladder sludge but otherwise unremarkable. Would be concerned about passage of microlithiasis or sludge causing transient biliary obstruction and biliary pancreatitis. Differential diagnosis includes also papillary stenosis, sphincter of OD dysfunction, malignancy.  She is scheduled for MRCP tomorrow. Await findings. Further recommendations to follow.

## 2017-05-10 ENCOUNTER — Ambulatory Visit (HOSPITAL_COMMUNITY)
Admission: RE | Admit: 2017-05-10 | Discharge: 2017-05-10 | Disposition: A | Discharge status: Home / Self Care | Payer: 59 | Source: Ambulatory Visit | Attending: Pediatrics | Admitting: Pediatrics

## 2017-05-10 ENCOUNTER — Other Ambulatory Visit: Payer: Self-pay | Admitting: Pediatrics

## 2017-05-10 DIAGNOSIS — K838 Other specified diseases of biliary tract: Secondary | ICD-10-CM | POA: Diagnosis not present

## 2017-05-10 DIAGNOSIS — K859 Acute pancreatitis without necrosis or infection, unspecified: Secondary | ICD-10-CM | POA: Insufficient documentation

## 2017-05-10 DIAGNOSIS — K831 Obstruction of bile duct: Secondary | ICD-10-CM | POA: Insufficient documentation

## 2017-05-10 DIAGNOSIS — R938 Abnormal findings on diagnostic imaging of other specified body structures: Secondary | ICD-10-CM | POA: Diagnosis not present

## 2017-05-10 MED ORDER — GADOBENATE DIMEGLUMINE 529 MG/ML IV SOLN
10.0000 mL | Freq: Once | INTRAVENOUS | Status: AC | PRN
  Administered 2017-05-10: 9 mL via INTRAVENOUS

## 2017-05-10 NOTE — Progress Notes (Signed)
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 To discuss with Dr. Gala Romney regarding need for ERCP.   May need serologies to support dx.

## 2017-05-11 NOTE — Progress Notes (Signed)
Discussed with Dr. Gala Romney. Discussed with patient.   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 Make sure they get CD of MRCP.   Patient will need to have colonoscopy within next 3 months due to association of PSC and Inflammatory bowel disease (especially UC). PLEASE MAKE F/U APPT HERE IN 8 WEEKS.   Patient advised to go to ED for worsening abd pain, fever. Currently she is asymptomatic.

## 2017-05-15 ENCOUNTER — Other Ambulatory Visit: Payer: Self-pay

## 2017-05-15 DIAGNOSIS — K8301 Primary sclerosing cholangitis: Secondary | ICD-10-CM

## 2017-05-15 NOTE — Progress Notes (Signed)
Forwarding to [facility] to refer to [facility]. Forwarding to Stacy to schedule the OV here in 8 weeks.

## 2017-05-15 NOTE — Progress Notes (Signed)
Stacey, please schedule the OV here in 8 weeks. Thanks!

## 2017-05-16 ENCOUNTER — Encounter: Payer: Self-pay | Admitting: Gastroenterology

## 2017-05-16 NOTE — Progress Notes (Signed)
PATIENT SCHEDULED AND LETTER SENT  °

## 2017-05-17 ENCOUNTER — Ambulatory Visit: Payer: 59 | Admitting: Pediatrics

## 2017-05-17 DIAGNOSIS — K831 Obstruction of bile duct: Secondary | ICD-10-CM | POA: Diagnosis not present

## 2017-05-17 DIAGNOSIS — K8020 Calculus of gallbladder without cholecystitis without obstruction: Secondary | ICD-10-CM | POA: Diagnosis not present

## 2017-05-17 DIAGNOSIS — R748 Abnormal levels of other serum enzymes: Secondary | ICD-10-CM | POA: Diagnosis not present

## 2017-05-24 ENCOUNTER — Encounter: Payer: Self-pay | Admitting: Pediatrics

## 2017-05-24 ENCOUNTER — Ambulatory Visit (INDEPENDENT_AMBULATORY_CARE_PROVIDER_SITE_OTHER): Payer: 59 | Admitting: Pediatrics

## 2017-05-24 VITALS — BP 97/72 | HR 70 | Temp 97.3°F | Ht 64.0 in | Wt 97.8 lb

## 2017-05-24 DIAGNOSIS — F419 Anxiety disorder, unspecified: Secondary | ICD-10-CM

## 2017-05-24 DIAGNOSIS — Z0000 Encounter for general adult medical examination without abnormal findings: Secondary | ICD-10-CM

## 2017-05-24 DIAGNOSIS — K8020 Calculus of gallbladder without cholecystitis without obstruction: Secondary | ICD-10-CM

## 2017-05-24 LAB — CMP14+EGFR
ALBUMIN: 4.4 g/dL (ref 3.5–5.5)
ALT: 130 IU/L — ABNORMAL HIGH (ref 0–32)
AST: 68 IU/L — AB (ref 0–40)
Albumin/Globulin Ratio: 1.6 (ref 1.2–2.2)
Alkaline Phosphatase: 188 IU/L — ABNORMAL HIGH (ref 39–117)
BUN/Creatinine Ratio: 19 (ref 9–23)
BUN: 16 mg/dL (ref 6–24)
Bilirubin Total: 0.2 mg/dL (ref 0.0–1.2)
CO2: 25 mmol/L (ref 20–29)
Calcium: 9.7 mg/dL (ref 8.7–10.2)
Chloride: 99 mmol/L (ref 96–106)
Creatinine, Ser: 0.83 mg/dL (ref 0.57–1.00)
GFR calc Af Amer: 91 mL/min/{1.73_m2} (ref >59)
GFR calc non Af Amer: 79 mL/min/{1.73_m2} (ref >59)
GLOBULIN, TOTAL: 2.7 g/dL (ref 1.5–4.5)
Glucose: 76 mg/dL (ref 65–99)
Potassium: 4.2 mmol/L (ref 3.5–5.2)
SODIUM: 137 mmol/L (ref 134–144)
Total Protein: 7.1 g/dL (ref 6.0–8.5)

## 2017-05-24 LAB — LIPID PANEL
CHOLESTEROL TOTAL: 171 mg/dL (ref 100–199)
Chol/HDL Ratio: 2.6 ratio (ref 0.0–4.4)
HDL: 65 mg/dL (ref >39)
LDL CALC: 91 mg/dL (ref 0–99)
Triglycerides: 77 mg/dL (ref 0–149)
VLDL Cholesterol Cal: 15 mg/dL (ref 5–40)

## 2017-05-24 MED ORDER — PRAVASTATIN SODIUM 40 MG PO TABS: 40.0000 mg | ORAL_TABLET | Freq: Every day | ORAL | 3 refills | Status: DC

## 2017-05-24 MED ORDER — CITALOPRAM HYDROBROMIDE 20 MG PO TABS: 20.0000 mg | ORAL_TABLET | Freq: Every day | ORAL | 3 refills | Status: DC

## 2017-05-24 NOTE — Progress Notes (Signed)
  Subjective:   Patient ID: Joyce Hopkins, female    DOB: Feb 27, 1961, 56 y.o.   MRN: [id number] CC: Annual Exam and Gynecologic Exam  HPI: Joyce Hopkins is a 56 y.o. female presenting for Annual Exam and Gynecologic Exam  Has ERCP at [facility] next week to deal with bile duct strictures Was recommended by GI to start statin, have cholecystectomy Pt hasnt   When not having stomach pain attacks, feeling great Happening about once a week Not as severe as before last visit doesnt last all night Has gotten more comfort from sitting up with heat Lasts 4-5 hrs  Abnormal pap smear at some point years ago, last was normal 3-4 yrs  Last mammo in [place]  No breast or colon cancer in family  Sister with melanoma in her teenage years  Racing thoughts and anxiety keep her awake at night  Relevant past medical, surgical, family and social history reviewed. Allergies and medications reviewed and updated. History  Smoking Status  . Never Smoker  Smokeless Tobacco  . Never Used   ROS: All systems negative other than what is in HPI  Objective:    BP 97/72   Pulse 70   Temp (!) 97.3 F (36.3 C) (Oral)   Ht 5' 4" (1.626 m)   Wt 97 lb 12.8 oz (44.4 kg)   BMI 16.79 kg/m   Wt Readings from Last 3 Encounters:  05/24/17 97 lb 12.8 oz (44.4 kg)  05/09/17 98 lb 12.8 oz (44.8 kg)  04/20/17 100 lb (45.4 kg)    Gen: NAD, alert, cooperative with exam, NCAT EYES: EOMI, no conjunctival injection, or no icterus ENT:  TMs pearly gray b/l, OP without erythema LYMPH: no cervical LAD CV: NRRR, normal S1/S2, no murmur, distal pulses 2+ b/l Resp: CTABL, no wheezes, normal WOB Abd: +BS, soft, mildly tender epigastric area with palpation, ND. no guarding or organomegaly Ext: No edema, warm Neuro: Alert and oriented, strength equal b/l UE and LE, coordination grossly normal MSK: normal muscle bulk Breast: nl exam b/l GU: nl external female genitalia, noraml appearing cervix, normal vaginal  ruggae  Assessment & Plan:  Joyce Hopkins was seen today for annual exam and gynecologic exam.  Diagnoses and all orders for this visit:  Encounter for preventative adult health care examination -     Lipid panel -     CMP14+EGFR -     Pap IG and HPV (high risk) DNA detection -     MM Digital Screening; Future  Calculus of gallbladder without cholecystitis without obstruction -     pravastatin (PRAVACHOL) 40 MG tablet; Take 1 tablet (40 mg total) by mouth daily.  Anxiety Start below -     citalopram (CELEXA) 20 MG tablet; Take 1 tablet (20 mg total) by mouth daily.   Follow up plan: Return in about 3 months (around 08/23/2017). [name], MD [place]

## 2017-05-24 NOTE — Patient Instructions (Addendum)
Joyce Hopkins Mammogram Appointment: [phone]  Take 1/2 tab citalopram for 2 weeks, then can increase to full tab

## 2017-05-26 ENCOUNTER — Encounter: Payer: Self-pay | Admitting: Pediatrics

## 2017-05-28 MED FILL — PRAVASTATIN NA 40 MG TAB: 40 | 90 days supply | Qty: 90 | Fill #0

## 2017-05-28 MED FILL — CITALOPRAM HBR 20 MG TABLET: 20 | 30 days supply | Qty: 30 | Fill #0

## 2017-05-29 LAB — PAP IG AND HPV HIGH-RISK
HPV, high-risk: NEGATIVE
PAP SMEAR COMMENT: 0

## 2017-05-30 MED FILL — traMADol HCL 50 MG TABS: 50 | 3 days supply | Qty: 6 | Fill #0

## 2017-06-01 ENCOUNTER — Encounter: Payer: Self-pay | Admitting: Pediatrics

## 2017-06-01 DIAGNOSIS — J45909 Unspecified asthma, uncomplicated: Secondary | ICD-10-CM | POA: Diagnosis not present

## 2017-06-01 DIAGNOSIS — K219 Gastro-esophageal reflux disease without esophagitis: Secondary | ICD-10-CM | POA: Diagnosis not present

## 2017-06-01 DIAGNOSIS — K8020 Calculus of gallbladder without cholecystitis without obstruction: Secondary | ICD-10-CM

## 2017-06-01 DIAGNOSIS — K828 Other specified diseases of gallbladder: Secondary | ICD-10-CM | POA: Diagnosis not present

## 2017-06-01 DIAGNOSIS — F419 Anxiety disorder, unspecified: Secondary | ICD-10-CM | POA: Diagnosis not present

## 2017-06-01 DIAGNOSIS — Z79899 Other long term (current) drug therapy: Secondary | ICD-10-CM | POA: Diagnosis not present

## 2017-06-01 DIAGNOSIS — K2970 Gastritis, unspecified, without bleeding: Secondary | ICD-10-CM | POA: Diagnosis not present

## 2017-06-01 DIAGNOSIS — H8110 Benign paroxysmal vertigo, unspecified ear: Secondary | ICD-10-CM | POA: Diagnosis not present

## 2017-06-01 DIAGNOSIS — Z886 Allergy status to analgesic agent status: Secondary | ICD-10-CM | POA: Diagnosis not present

## 2017-06-01 DIAGNOSIS — Z881 Allergy status to other antibiotic agents status: Secondary | ICD-10-CM | POA: Diagnosis not present

## 2017-06-12 ENCOUNTER — Ambulatory Visit: Payer: 59 | Admitting: Gastroenterology

## 2017-06-25 ENCOUNTER — Other Ambulatory Visit: Payer: Self-pay | Admitting: General Surgery

## 2017-06-25 DIAGNOSIS — K831 Obstruction of bile duct: Secondary | ICD-10-CM | POA: Diagnosis not present

## 2017-06-25 DIAGNOSIS — K8050 Calculus of bile duct without cholangitis or cholecystitis without obstruction: Secondary | ICD-10-CM | POA: Diagnosis not present

## 2017-06-29 MED FILL — CITALOPRAM HBR 20 MG TABLET: 20 | 30 days supply | Qty: 30 | Fill #1

## 2017-07-16 NOTE — Pre-Procedure Instructions (Signed)
Joyce Hopkins  07/16/2017      [place], [place] - [address] [place] [address] Phone: [phone] Fax: [phone]    Your procedure is scheduled on July 19, 2017.  Report to [facility] Admitting at 0830 AM.  Call this number if you have problems the morning of surgery:  [phone]   Remember:  Do not eat food or drink liquids after midnight.  Take these medicines the morning of surgery with A SIP OF WATER albuterol (proair) inhaler <bring inhaler with you>, alprazolam (xanax)-if needed, cetirizie (zyrtec), citalopram (celexa), hycoscyamine (levsin), pantoprazole (protonix).   7 days prior to surgery STOP taking any Aspirin (unless otherwise instructed by your surgeon), Aleve, Naproxen, Ibuprofen, Motrin, Advil, Goody's, BC's, all herbal medications, fish oil, and all vitamins  Continue all other medications as instructed by your physician except follow the above medication instructions before surgery   Do not wear jewelry, make-up or nail polish.  Do not wear lotions, powders, or perfumes, or deoderant.  Do not shave 48 hours prior to surgery.    Do not bring valuables to the hospital.  [facility] is not responsible for any belongings or valuables.  Contacts, dentures or bridgework may not be worn into surgery.  Leave your suitcase in the car.  After surgery it may be brought to your room.  For patients admitted to the hospital, discharge time will be determined by your treatment team.  Patients discharged the day of surgery will not be allowed to drive home.   Special instructions:   [place]- Preparing For Surgery  Before surgery, you can play an important role. Because skin is not sterile, your skin needs to be as free of germs as possible. You can reduce the number of germs on your skin by washing with CHG (chlorahexidine gluconate) Soap before surgery.  CHG is an antiseptic cleaner  which kills germs and bonds with the skin to continue killing germs even after washing.  Please do not use if you have an allergy to CHG or antibacterial soaps. If your skin becomes reddened/irritated stop using the CHG.  Do not shave (including legs and underarms) for at least 48 hours prior to first CHG shower. It is OK to shave your face.  Please follow these instructions carefully.   1. Shower the NIGHT BEFORE SURGERY and the MORNING OF SURGERY with CHG.   2. If you chose to wash your hair, wash your hair first as usual with your normal shampoo.  3. After you shampoo, rinse your hair and body thoroughly to remove the shampoo.  4. Use CHG as you would any other liquid soap. You can apply CHG directly to the skin and wash gently with a scrungie or a clean washcloth.   5. Apply the CHG Soap to your body ONLY FROM THE NECK DOWN.  Do not use on open wounds or open sores. Avoid contact with your eyes, ears, mouth and genitals (private parts). Wash Face and genitals (private parts)  with your normal soap.  6. Wash thoroughly, paying special attention to the area where your surgery will be performed.  7. Thoroughly rinse your body with warm water from the neck down.  8. DO NOT shower/wash with your normal soap after using and rinsing off the CHG Soap.  9. Pat yourself dry with a CLEAN TOWEL.  10. Wear CLEAN PAJAMAS to bed the night before surgery, wear comfortable clothes the  morning of surgery  11. Place CLEAN SHEETS on your bed the night of your first shower and DO NOT SLEEP WITH PETS.    Day of Surgery: Do not apply any deodorants/lotions. Please wear clean clothes to the hospital/surgery center.     Please read over the following fact sheets that you were given. Pain Booklet, Coughing and Deep Breathing, MRSA Information and Surgical Site Infection Prevention

## 2017-07-17 ENCOUNTER — Encounter (HOSPITAL_COMMUNITY)
Admission: RE | Admit: 2017-07-17 | Discharge: 2017-07-17 | Disposition: A | Discharge status: Home / Self Care | Payer: 59 | Source: Ambulatory Visit | Attending: General Surgery | Admitting: General Surgery

## 2017-07-17 ENCOUNTER — Other Ambulatory Visit: Payer: Self-pay

## 2017-07-17 ENCOUNTER — Ambulatory Visit: Payer: 59 | Admitting: Gastroenterology

## 2017-07-17 ENCOUNTER — Encounter (HOSPITAL_COMMUNITY): Payer: Self-pay

## 2017-07-17 DIAGNOSIS — K8010 Calculus of gallbladder with chronic cholecystitis without obstruction: Secondary | ICD-10-CM | POA: Diagnosis not present

## 2017-07-17 HISTORY — DX: Calculus of bile duct without cholangitis or cholecystitis without obstruction: K80.50

## 2017-07-17 LAB — CBC
HCT: 40.8 % (ref 36.0–46.0)
Hemoglobin: 13.1 g/dL (ref 12.0–15.0)
MCH: 29.3 pg (ref 26.0–34.0)
MCHC: 32.1 g/dL (ref 30.0–36.0)
MCV: 91.3 fL (ref 78.0–100.0)
PLATELETS: 217 10*3/uL (ref 150–400)
RBC: 4.47 MIL/uL (ref 3.87–5.11)
RDW: 13.5 % (ref 11.5–15.5)
WBC: 6.4 10*3/uL (ref 4.0–10.5)

## 2017-07-17 LAB — COMPREHENSIVE METABOLIC PANEL
ALT: 56 U/L — AB (ref 14–54)
AST: 34 U/L (ref 15–41)
Albumin: 3.9 g/dL (ref 3.5–5.0)
Alkaline Phosphatase: 137 U/L — ABNORMAL HIGH (ref 38–126)
Anion gap: 8 (ref 5–15)
BILIRUBIN TOTAL: 0.6 mg/dL (ref 0.3–1.2)
BUN: 13 mg/dL (ref 6–20)
CALCIUM: 9.6 mg/dL (ref 8.9–10.3)
CHLORIDE: 105 mmol/L (ref 101–111)
CO2: 25 mmol/L (ref 22–32)
CREATININE: 0.77 mg/dL (ref 0.44–1.00)
Glucose, Bld: 70 mg/dL (ref 65–99)
Potassium: 4.5 mmol/L (ref 3.5–5.1)
Sodium: 138 mmol/L (ref 135–145)
Total Protein: 7.3 g/dL (ref 6.5–8.1)

## 2017-07-17 NOTE — Progress Notes (Signed)
PCP - Dr. [name]- [facility]  Cardiologist - Denies  Chest x-ray - Denies  EKG - Denies  Stress Test - Denies  ECHO - Denies  Cardiac Cath - Denies  Sleep Study - No CPAP - None    Pt denies having chest pain, sob, or fever at this time. All instructions explained to the pt, with a verbal understanding of the material. Pt agrees to go over the instructions while at home for a better understanding. The opportunity to ask questions was provided.

## 2017-07-18 NOTE — H&P (Signed)
Joyce Hopkins 06/25/2017 9:02 AM Location: [facility] Patient #: [id number] DOB: [birth year] Undefined / Language: Joyce Hopkins / Race: White Female   History of Present Illness Stark Klein MD; 06/25/2017 9:54 AM) The patient is a 56 year old female who presents for evaluation of gall stones. Pt is a 56 yo F referred for consultation by Dr. Evette Doffing for gallbladder sludge/stone and biliary stricture. The patient presented in mid August and had around 6 weeks of intermittent episodes of epigastric and right upper quadrant pain. She states that the worst episodes radiated to her back. They originally were one a week and then increased to at most 4 times a week. Each episode would last around 4 hours and resolved spontaneously. Nothing that she tried would help including over-the-counter analgesics and antacids. Once the pain finally resolved, the patient states that she felt like she was having chills but did not feel like she was having a fever. She denies any family history of gallbladder, liver, pancreas, colon problems. She does not have any rheumatologic diseases of which she is aware. There is suggestion on her MRI that she may have sclerosing cholangitis. She was seen by GI at [facility] who did an endoscopy but did not see any abnormalities to leave them to do an ERCP. Her liver function tests have been coming down since the original measurement in August where her transaminases and alkaline phosphatase were elevated. At [facility], they started her on a statin in order to diminish her serum cholesterol. She has not had any episodes at this point for 2 weeks.   Labs 9/13 Alk phos 188, AST 68, ALT 130. Was 256, 671, and 544 respectively 04/20/2017  RUQ u/s 04/25/2017 IMPRESSION: Gallbladder mildly distended with an mild amount of sludge noted in the gallbladder. No gallstones evident. No gallbladder Calderone thickening or pericholecystic fluid.  Study otherwise  unremarkable.  MR abd MRCP 05/10/2017 IMPRESSION: Mild biliary ductal dilatation and stricture of distal common bile duct. Irregular/beaded appearance of bile ducts raises suspicion for sclerosing cholangitis. Consider ERCP for further evaluation.  No evidence of choledocholithiasis or pancreatic abnormality.  Probable tiny gallstone. No radiographic evidence of cholecystitis.    Allergies (April Staton, CMA; 06/25/2017 9:05 AM) Tylenol *ANALGESICS - NonNarcotic*  LevoFLOXacin *CHEMICALS*   Medication History (April Staton, CMA; 06/25/2017 9:06 AM) Citalopram Hydrobromide (20MG Tablet, Oral) Active. Pravastatin Sodium (40MG Tablet, Oral) Active. ZyrTEC Allergy (10MG Tablet, Oral) Active. ALPRAZolam (0.5MG Tablet, Oral) Active. ProAir HFA (108 (90 Base)MCG/ACT Aerosol Soln, Inhalation) Active. Hyoscyamine Sulfate (0.125MG Tablet, Oral) Active. Pantoprazole Sodium (40MG Tablet DR, Oral) Active. Medications Reconciled    Review of Systems Stark Klein MD; 06/25/2017 9:54 AM) All other systems negative  Vitals (April Staton [place]; 06/25/2017 9:07 AM) 06/25/2017 9:07 AM Weight: 96.38 lb Height: 64in Body Surface Area: 1.43 m Body Mass Index: 16.54 kg/m  Temp.: 77F(Oral)  Pulse: 66 (Regular)  BP: 80/54 (Sitting, Left Arm, Standard)       Physical Exam Stark Klein MD; 06/25/2017 9:55 AM) General Mental Status-Alert. General Appearance-Consistent with stated age. Hydration-Well hydrated. Voice-Normal. Note: very thin.   Head and Neck Head-normocephalic, atraumatic with no lesions or palpable masses. Trachea-midline. Thyroid Gland Characteristics - normal size and consistency.  Eye Eyeball - Bilateral-Extraocular movements intact. Sclera/Conjunctiva - Bilateral-No scleral icterus.  Chest and Lung Exam Chest and lung exam reveals -quiet, even and easy respiratory effort with no use of accessory muscles and on  auscultation, normal breath sounds, no adventitious sounds and normal vocal resonance.  Inspection Chest Emme - Normal. Back - normal.  Cardiovascular Cardiovascular examination reveals -normal heart sounds, regular rate and rhythm with no murmurs and normal pedal pulses bilaterally.  Abdomen Inspection Inspection of the abdomen reveals - No Hernias. Palpation/Percussion Palpation and Percussion of the abdomen reveal - Soft, Non Tender, No Rebound tenderness, No Rigidity (guarding) and No hepatosplenomegaly. Auscultation Auscultation of the abdomen reveals - Bowel sounds normal. Note: low rib cage.   Neurologic Neurologic evaluation reveals -alert and oriented x 3 with no impairment of recent or remote memory. Mental Status-Normal.  Musculoskeletal Global Assessment -Note: no gross deformities.  Normal Exam - Left-Upper Extremity Strength Normal and Lower Extremity Strength Normal. Normal Exam - Right-Upper Extremity Strength Normal and Lower Extremity Strength Normal.  Lymphatic Head & Neck  General Head & Neck Lymphatics: Bilateral - Description - Normal. Axillary  General Axillary Region: Bilateral - Description - Normal. Tenderness - Non Tender. Femoral & Inguinal  Generalized Femoral & Inguinal Lymphatics: Bilateral - Description - No Generalized lymphadenopathy.    Assessment & Plan Stark Klein MD; 06/25/2017 6:12 AM) BILIARY COLIC (A44.97) Impression: The patient has symptoms consistent with biliary colic. The questionable stricturing is mild at best. I think if we were to remove her gallbladder and do an intraoperative cholangiogram that it would be the best look at her bile duct. It would be uncommon for her to develop sclerosing cholangitis at this point in her life with no family history of rheumatologic diseases or colitis. She has had sludge and stone seen on MRI. Her symptoms are very classic for biliary colic. If she did have a mild stricture in  her distal common bile duct, it certainly would be less likely to have temporary biliary obstruction if the source of stones and sludge were removed.  The patient would prefer not to have surgery if she doesn't need to. She would like to give a trial period to see if the symptoms recurred and allergies, closer on medicine. I think that is unlikely to give her adequate relief given the number of episodes that she has had so far.  The surgical procedure was described to the patient in detail. The patient was given educational material. I discussed the incision type and location, the location of the gallbladder, the anatomy of the bile ducts and arteries, and the typical progression of surgery. I discussed the possibility of converting to an open operation. I advised of the risks of bleeding, infection, damage to other structures (such as the bile duct, intestine or liver), bile leak, need for other procedures or surgeries, and post op diarrhea/constipation. We discussed the risk of blood clot. We discussed the recovery period and post operative restrictions. The patient was advised against taking blood thinners the week before surgery. BILIARY STRICTURE (K83.1) Impression: see above. Current Plans Schedule for Surgery Pt Education - Laparoscopic Cholecystectomy: gallbladder   Signed by Stark Klein, MD (06/25/2017 9:58 AM)

## 2017-07-19 ENCOUNTER — Ambulatory Visit (HOSPITAL_COMMUNITY): Payer: 59 | Admitting: Certified Registered Nurse Anesthetist

## 2017-07-19 ENCOUNTER — Ambulatory Visit (HOSPITAL_COMMUNITY)
Admission: RE | Admit: 2017-07-19 | Discharge: 2017-07-19 | Disposition: A | Discharge status: Home / Self Care | Payer: 59 | Source: Ambulatory Visit | Attending: General Surgery | Admitting: General Surgery

## 2017-07-19 ENCOUNTER — Encounter (HOSPITAL_COMMUNITY)
Admission: RE | Disposition: A | Discharge status: Home / Self Care | Payer: Self-pay | Source: Ambulatory Visit | Attending: General Surgery

## 2017-07-19 ENCOUNTER — Ambulatory Visit (HOSPITAL_COMMUNITY): Payer: 59

## 2017-07-19 DIAGNOSIS — R1013 Epigastric pain: Secondary | ICD-10-CM | POA: Diagnosis not present

## 2017-07-19 DIAGNOSIS — K8010 Calculus of gallbladder with chronic cholecystitis without obstruction: Secondary | ICD-10-CM | POA: Diagnosis not present

## 2017-07-19 DIAGNOSIS — K8020 Calculus of gallbladder without cholecystitis without obstruction: Secondary | ICD-10-CM | POA: Diagnosis not present

## 2017-07-19 DIAGNOSIS — R945 Abnormal results of liver function studies: Secondary | ICD-10-CM | POA: Diagnosis not present

## 2017-07-19 DIAGNOSIS — Z419 Encounter for procedure for purposes other than remedying health state, unspecified: Secondary | ICD-10-CM

## 2017-07-19 DIAGNOSIS — J4520 Mild intermittent asthma, uncomplicated: Secondary | ICD-10-CM | POA: Diagnosis not present

## 2017-07-19 HISTORY — PX: CHOLECYSTECTOMY: SHX55

## 2017-07-19 SURGERY — LAPAROSCOPIC CHOLECYSTECTOMY WITH INTRAOPERATIVE CHOLANGIOGRAM
Anesthesia: General | Site: Abdomen

## 2017-07-19 MED ORDER — ROCURONIUM BROMIDE 10 MG/ML (PF) SYRINGE
PREFILLED_SYRINGE | INTRAVENOUS | Status: AC
  Filled 2017-07-19: qty 5

## 2017-07-19 MED ORDER — MIDAZOLAM HCL 2 MG/2ML IJ SOLN
INTRAMUSCULAR | Status: AC
  Filled 2017-07-19: qty 2

## 2017-07-19 MED ORDER — OXYCODONE HCL 5 MG/5ML PO SOLN: 5.0000 mg | Freq: Once | ORAL | Status: AC | PRN

## 2017-07-19 MED ORDER — SODIUM CHLORIDE 0.9 % IV SOLN: 250.0000 mL | INTRAVENOUS | Status: DC | PRN

## 2017-07-19 MED ORDER — LACTATED RINGERS IV SOLN
INTRAVENOUS | Status: DC | PRN
  Administered 2017-07-19 (×2): via INTRAVENOUS

## 2017-07-19 MED ORDER — FENTANYL CITRATE (PF) 250 MCG/5ML IJ SOLN
INTRAMUSCULAR | Status: AC
  Filled 2017-07-19: qty 5

## 2017-07-19 MED ORDER — SODIUM CHLORIDE 0.9 % IV SOLN
INTRAVENOUS | Status: DC | PRN
  Administered 2017-07-19: 5 mL

## 2017-07-19 MED ORDER — OXYCODONE HCL 5 MG PO TABS: 5.0000 mg | ORAL_TABLET | Freq: Four times a day (QID) | ORAL | 0 refills | Status: DC | PRN

## 2017-07-19 MED ORDER — PROPOFOL 10 MG/ML IV BOLUS
INTRAVENOUS | Status: AC
  Filled 2017-07-19: qty 20

## 2017-07-19 MED ORDER — ONDANSETRON HCL 4 MG/2ML IJ SOLN: 4.0000 mg | Freq: Once | INTRAMUSCULAR | Status: DC | PRN

## 2017-07-19 MED ORDER — PROPOFOL 10 MG/ML IV BOLUS
INTRAVENOUS | Status: DC | PRN
  Administered 2017-07-19: 150 mg via INTRAVENOUS

## 2017-07-19 MED ORDER — MIDAZOLAM HCL 5 MG/5ML IJ SOLN
INTRAMUSCULAR | Status: DC | PRN
  Administered 2017-07-19 (×2): 1 mg via INTRAVENOUS

## 2017-07-19 MED ORDER — LACTATED RINGERS IV SOLN
Freq: Once | INTRAVENOUS | Status: AC
  Administered 2017-07-19: 09:00:00 via INTRAVENOUS

## 2017-07-19 MED ORDER — IOPAMIDOL (ISOVUE-300) INJECTION 61%
INTRAVENOUS | Status: AC
  Filled 2017-07-19: qty 50

## 2017-07-19 MED ORDER — SUGAMMADEX SODIUM 500 MG/5ML IV SOLN
INTRAVENOUS | Status: DC | PRN
Start: 2017-07-19 — End: 2017-07-19
  Administered 2017-07-19: 200 mg via INTRAVENOUS

## 2017-07-19 MED ORDER — FENTANYL CITRATE (PF) 100 MCG/2ML IJ SOLN
INTRAMUSCULAR | Status: AC
  Administered 2017-07-19: 25 ug via INTRAVENOUS
  Filled 2017-07-19: qty 2

## 2017-07-19 MED ORDER — FENTANYL CITRATE (PF) 100 MCG/2ML IJ SOLN
INTRAMUSCULAR | Status: DC | PRN
  Administered 2017-07-19: 100 ug via INTRAVENOUS

## 2017-07-19 MED ORDER — ONDANSETRON HCL 4 MG/2ML IJ SOLN
INTRAMUSCULAR | Status: AC
  Filled 2017-07-19: qty 2

## 2017-07-19 MED ORDER — LIDOCAINE HCL 1 % IJ SOLN
INTRAMUSCULAR | Status: DC | PRN
  Administered 2017-07-19: 10 mL

## 2017-07-19 MED ORDER — FENTANYL CITRATE (PF) 100 MCG/2ML IJ SOLN
25.0000 ug | INTRAMUSCULAR | Status: DC | PRN
  Administered 2017-07-19: 50 ug via INTRAVENOUS
  Administered 2017-07-19 (×4): 25 ug via INTRAVENOUS

## 2017-07-19 MED ORDER — OXYCODONE HCL 5 MG PO TABS: 5.0000 mg | ORAL_TABLET | ORAL | Status: DC | PRN

## 2017-07-19 MED ORDER — OXYCODONE HCL 5 MG PO TABS
5.0000 mg | ORAL_TABLET | Freq: Once | ORAL | Status: AC | PRN
  Administered 2017-07-19: 5 mg via ORAL

## 2017-07-19 MED ORDER — CEFAZOLIN SODIUM-DEXTROSE 2-4 GM/100ML-% IV SOLN
2.0000 g | INTRAVENOUS | Status: AC
  Administered 2017-07-19: 2 g via INTRAVENOUS

## 2017-07-19 MED ORDER — DEXAMETHASONE SODIUM PHOSPHATE 10 MG/ML IJ SOLN
INTRAMUSCULAR | Status: AC
  Filled 2017-07-19: qty 1

## 2017-07-19 MED ORDER — LIDOCAINE HCL (CARDIAC) 20 MG/ML IV SOLN
INTRAVENOUS | Status: DC | PRN
  Administered 2017-07-19: 50 mg via INTRAVENOUS

## 2017-07-19 MED ORDER — DEXAMETHASONE SODIUM PHOSPHATE 10 MG/ML IJ SOLN
INTRAMUSCULAR | Status: DC | PRN
  Administered 2017-07-19: 10 mg via INTRAVENOUS

## 2017-07-19 MED ORDER — CHLORHEXIDINE GLUCONATE CLOTH 2 % EX PADS: 6.0000 | MEDICATED_PAD | Freq: Once | CUTANEOUS | Status: DC

## 2017-07-19 MED ORDER — LIDOCAINE 2% (20 MG/ML) 5 ML SYRINGE
INTRAMUSCULAR | Status: AC
  Filled 2017-07-19: qty 5

## 2017-07-19 MED ORDER — SODIUM CHLORIDE 0.9% FLUSH: 3.0000 mL | INTRAVENOUS | Status: DC | PRN

## 2017-07-19 MED ORDER — 0.9 % SODIUM CHLORIDE (POUR BTL) OPTIME
TOPICAL | Status: DC | PRN
  Administered 2017-07-19: 1000 mL

## 2017-07-19 MED ORDER — OXYCODONE HCL 5 MG PO TABS
ORAL_TABLET | ORAL | Status: AC
  Administered 2017-07-19: 5 mg via ORAL
  Filled 2017-07-19: qty 1

## 2017-07-19 MED ORDER — ROCURONIUM BROMIDE 100 MG/10ML IV SOLN
INTRAVENOUS | Status: DC | PRN
  Administered 2017-07-19: 40 mg via INTRAVENOUS

## 2017-07-19 MED ORDER — ONDANSETRON HCL 4 MG/2ML IJ SOLN
INTRAMUSCULAR | Status: DC | PRN
  Administered 2017-07-19: 4 mg via INTRAVENOUS

## 2017-07-19 MED ORDER — SODIUM CHLORIDE 0.9% FLUSH: 3.0000 mL | Freq: Two times a day (BID) | INTRAVENOUS | Status: DC

## 2017-07-19 MED ORDER — SODIUM CHLORIDE 0.9 % IR SOLN
Status: DC | PRN
  Administered 2017-07-19: 1000 mL

## 2017-07-19 MED FILL — oxyCODONE HCL 5 MG TABS: 5 | 2 days supply | Qty: 20 | Fill #0

## 2017-07-19 SURGICAL SUPPLY — 41 items
APPLIER CLIP ROT 10 11.4 M/L (STAPLE) ×3
BLADE CLIPPER SURG (BLADE) IMPLANT
CANISTER SUCT 3000ML PPV (MISCELLANEOUS) ×3 IMPLANT
CHLORAPREP W/TINT 26ML (MISCELLANEOUS) ×3 IMPLANT
CLIP APPLIE ROT 10 11.4 M/L (STAPLE) ×1 IMPLANT
COVER MAYO STAND STRL (DRAPES) ×3 IMPLANT
COVER SURGICAL LIGHT HANDLE (MISCELLANEOUS) ×3 IMPLANT
DERMABOND ADVANCED (GAUZE/BANDAGES/DRESSINGS) ×2
DERMABOND ADVANCED .7 DNX12 (GAUZE/BANDAGES/DRESSINGS) ×1 IMPLANT
DRAPE C-ARM 42X72 X-RAY (DRAPES) ×3 IMPLANT
DRAPE WARM FLUID 44X44 (DRAPE) ×3 IMPLANT
ELECT REM PT RETURN 9FT ADLT (ELECTROSURGICAL) ×3
ELECTRODE REM PT RTRN 9FT ADLT (ELECTROSURGICAL) ×1 IMPLANT
FILTER SMOKE EVAC LAPAROSHD (FILTER) IMPLANT
GLOVE BIO SURGEON STRL SZ 6 (GLOVE) ×3 IMPLANT
GLOVE BIOGEL PI IND STRL 6.5 (GLOVE) ×1 IMPLANT
GLOVE BIOGEL PI INDICATOR 6.5 (GLOVE) ×2
GOWN STRL REUS W/ TWL LRG LVL3 (GOWN DISPOSABLE) ×2 IMPLANT
GOWN STRL REUS W/TWL 2XL LVL3 (GOWN DISPOSABLE) ×3 IMPLANT
GOWN STRL REUS W/TWL LRG LVL3 (GOWN DISPOSABLE) ×4
KIT BASIN OR (CUSTOM PROCEDURE TRAY) ×3 IMPLANT
KIT ROOM TURNOVER OR (KITS) ×3 IMPLANT
L-HOOK LAP DISP 36CM (ELECTROSURGICAL) ×3
LHOOK LAP DISP 36CM (ELECTROSURGICAL) ×1 IMPLANT
NS IRRIG 1000ML POUR BTL (IV SOLUTION) ×3 IMPLANT
PAD ARMBOARD 7.5X6 YLW CONV (MISCELLANEOUS) ×3 IMPLANT
PENCIL BUTTON HOLSTER BLD 10FT (ELECTRODE) ×3 IMPLANT
POUCH SPECIMEN RETRIEVAL 10MM (ENDOMECHANICALS) ×3 IMPLANT
SCISSORS LAP 5X35 DISP (ENDOMECHANICALS) ×3 IMPLANT
SET CHOLANGIOGRAPH 5 50 .035 (SET/KITS/TRAYS/PACK) ×3 IMPLANT
SET IRRIG TUBING LAPAROSCOPIC (IRRIGATION / IRRIGATOR) ×3 IMPLANT
SLEEVE ENDOPATH XCEL 5M (ENDOMECHANICALS) ×3 IMPLANT
SPECIMEN JAR SMALL (MISCELLANEOUS) ×3 IMPLANT
SUT MNCRL AB 4-0 PS2 18 (SUTURE) ×3 IMPLANT
TOWEL OR 17X24 6PK STRL BLUE (TOWEL DISPOSABLE) ×3 IMPLANT
TOWEL OR 17X26 10 PK STRL BLUE (TOWEL DISPOSABLE) ×3 IMPLANT
TRAY LAPAROSCOPIC MC (CUSTOM PROCEDURE TRAY) ×3 IMPLANT
TROCAR XCEL BLUNT TIP 100MML (ENDOMECHANICALS) ×3 IMPLANT
TROCAR XCEL NON-BLD 11X100MML (ENDOMECHANICALS) ×3 IMPLANT
TROCAR XCEL NON-BLD 5MMX100MML (ENDOMECHANICALS) ×3 IMPLANT
TUBING INSUFFLATION (TUBING) ×3 IMPLANT

## 2017-07-19 NOTE — Discharge Instructions (Addendum)
[facility] Office Phone Number [phone]   POST OP INSTRUCTIONS  Always review your discharge instruction sheet given to you by the facility where your surgery was performed.  IF YOU HAVE DISABILITY OR FAMILY LEAVE FORMS, YOU MUST BRING THEM TO THE OFFICE FOR PROCESSING.  DO NOT GIVE THEM TO YOUR DOCTOR.  1. A prescription for pain medication may be given to you upon discharge.  Take your pain medication as prescribed, if needed.  If narcotic pain medicine is not needed, then you may take acetaminophen (Tylenol) or ibuprofen (Advil) as needed. 2. Take your usually prescribed medications unless otherwise directed 3. If you need a refill on your pain medication, please contact your pharmacy.  They will contact our office to request authorization.  Prescriptions will not be filled after 5pm or on week-ends. 4. You should eat very light the first 24 hours after surgery, such as soup, crackers, pudding, etc.  Resume your normal diet the day after surgery 5. It is common to experience some constipation if taking pain medication after surgery.  Increasing fluid intake and taking a stool softener will usually help or prevent this problem from occurring.  A mild laxative (Milk of Magnesia or Miralax) should be taken according to package directions if there are no bowel movements after 48 hours. 6. You may shower in 48 hours.  The surgical glue will flake off in 2-3 weeks.   7. ACTIVITIES:  No strenuous activity or heavy lifting for 1 week.   a. You may drive when you no longer are taking prescription pain medication, you can comfortably wear a seatbelt, and you can safely maneuver your car and apply brakes. b. RETURN TO WORK:  __________1-2 weeks as long as no lifting.  _______________ Dennis Bast should see your doctor in the office for a follow-up appointment approximately three-four weeks after your surgery.    WHEN TO CALL YOUR DOCTOR: 1. Fever over 101.0 2. Nausea and/or  vomiting. 3. Extreme swelling or bruising. 4. Continued bleeding from incision. 5. Increased pain, redness, or drainage from the incision.  The clinic staff is available to answer your questions during regular business hours.  Please dont hesitate to call and ask to speak to one of the nurses for clinical concerns.  If you have a medical emergency, go to the nearest emergency room or call 911.  A surgeon from [facility] is always on call at the hospital.  For further questions, please visit [email or web address]

## 2017-07-19 NOTE — Anesthesia Procedure Notes (Signed)
Procedure Name: Intubation Date/Time: 07/19/2017 11:00 AM Performed by: Roberts Gaudy, MD Pre-anesthesia Checklist: Patient identified, Emergency Drugs available, Patient being monitored, Suction available and Timeout performed Patient Re-evaluated:Patient Re-evaluated prior to induction Oxygen Delivery Method: Circle system utilized Preoxygenation: Pre-oxygenation with 100% oxygen Induction Type: IV induction Ventilation: Mask ventilation without difficulty Laryngoscope Size: Mac and 4 Grade View: Grade II Tube size: 7.5 mm Number of attempts: 1 Airway Equipment and Method: Stylet Placement Confirmation: ETT inserted through vocal cords under direct vision,  positive ETCO2 and breath sounds checked- equal and bilateral Secured at: 21 cm Tube secured with: Tape Dental Injury: Teeth and Oropharynx as per pre-operative assessment

## 2017-07-19 NOTE — Anesthesia Preprocedure Evaluation (Signed)
Anesthesia Evaluation  Patient identified by MRN, date of birth, ID band Patient awake    Reviewed: Allergy & Precautions, NPO status , Patient's Chart, lab work & pertinent test results  Airway Mallampati: II  TM Distance: >3 FB Neck ROM: Full    Dental  (+) Teeth Intact, Dental Advisory Given   Pulmonary    breath sounds clear to auscultation       Cardiovascular  Rhythm:Regular Rate:Normal     Neuro/Psych    GI/Hepatic   Endo/Other    Renal/GU      Musculoskeletal   Abdominal   Peds  Hematology   Anesthesia Other Findings   Reproductive/Obstetrics                             Anesthesia Physical Anesthesia Plan  ASA: II  Anesthesia Plan: General   Post-op Pain Management:    Induction: Intravenous  PONV Risk Score and Plan: 1 and Ondansetron and Dexamethasone  Airway Management Planned: Oral ETT  Additional Equipment:   Intra-op Plan:   Post-operative Plan: Extubation in OR  Informed Consent: I have reviewed the patients History and Physical, chart, labs and discussed the procedure including the risks, benefits and alternatives for the proposed anesthesia with the patient or authorized representative who has indicated his/her understanding and acceptance.     Dental advisory given  Plan Discussed with: CRNA and Anesthesiologist  Anesthesia Plan Comments:         Anesthesia Quick Evaluation  

## 2017-07-19 NOTE — Transfer of Care (Signed)
Immediate Anesthesia Transfer of Care Note  Patient: Joyce Hopkins  Procedure(s) Performed: LAPAROSCOPIC CHOLECYSTECTOMY WITH INTRAOPERATIVE CHOLANGIOGRAM (N/A Abdomen)  Patient Location: PACU  Anesthesia Type:General  Level of Consciousness: awake, oriented, drowsy, patient cooperative, lethargic and responds to stimulation  Airway & Oxygen Therapy: Patient Spontanous Breathing and Patient connected to face mask oxygen  Post-op Assessment: Report given to RN, Post -op Vital signs reviewed and stable and Patient moving all extremities  Post vital signs: Reviewed and stable  Last Vitals:  Vitals:   07/19/17 0842  BP: 109/71  Pulse: 65  Resp: 18  Temp: (!) 36.4 C  SpO2: 100%    Last Pain:  Vitals:   07/19/17 0842  TempSrc: Oral         Complications: No apparent anesthesia complications

## 2017-07-19 NOTE — Op Note (Signed)
Laparoscopic Cholecystectomy with IOC Procedure Note  Indications: This patient presents with elevated liver enzymes, a gallstone,and attacks of pain, and will undergo laparoscopic cholecystectomy.  Pre-operative Diagnosis: symptomatic cholelithiasis  Post-operative Diagnosis: Same  Surgeon: Stark Klein   Assistants: Judyann Munson, RNFA  Anesthesia: General endotracheal anesthesia and local  ASA Class: 2  Procedure Details  The patient was seen again in the Holding Room. The risks, benefits, complications, treatment options, and expected outcomes were discussed with the patient. The possibilities of  bleeding, recurrent infection, damage to nearby structures, the need for additional procedures, failure to diagnose a condition, the possible need to convert to an open procedure, and creating a complication requiring transfusion or operation were discussed with the patient. The likelihood of improving the patient's symptoms with return to their baseline status is good.    The patient and/or family concurred with the proposed plan, giving informed consent. The site of surgery properly noted. The patient was taken to Operating Room, and the procedure verified as Laparoscopic Cholecystectomy with Intraoperative Cholangiogram. A Time Out was held and the above information confirmed.  Prior to the induction of general anesthesia, antibiotic prophylaxis was administered. General endotracheal anesthesia was then administered and tolerated well. After the induction, the abdomen was prepped with Chloraprep and draped in the sterile fashion. The patient was positioned in the supine position.  Local anesthetic agent was injected into the skin near the umbilicus and an incision made. We dissected down to the abdominal fascia with blunt dissection.  The fascia was incised vertically and we entered the peritoneal cavity bluntly.  A pursestring suture of 0-Vicryl was placed around the fascial opening.  The  Hasson cannula was inserted and secured with the stay suture.  Pneumoperitoneum was then created with CO2 and tolerated well without any adverse changes in the patient's vital signs. An 11-mm port was placed in the subxiphoid position.  Two 5-mm ports were placed in the right upper quadrant. All skin incisions were infiltrated with a local anesthetic agent before making the incision and placing the trocars.   We positioned the patient in reverse Trendelenburg, tilted slightly to the patient's left.  The gallbladder was identified, the fundus grasped and retracted cephalad. Adhesions were lysed bluntly and with the electrocautery where indicated, taking care not to injure any adjacent organs or viscus. The infundibulum was grasped and retracted laterally, exposing the peritoneum overlying the triangle of Calot. This was then divided and exposed in a blunt fashion. A critical view of the cystic duct and cystic artery was obtained.  The cystic duct was clearly identified and bluntly dissected circumferentially. The cystic duct was ligated with a clip distally.   An incision was made in the cystic duct and the [facility] cholangiogram catheter introduced. The catheter was secured using a clip. A cholangiogram was then performed, demonstrating beaded biliary tree with a common bile duct stricture.  Contrast filled the duodenum and the right and left biliary tree.    The cystic duct was then ligated with clips and divided. The cystic artery was identified, dissected free, ligated with clips and divided as well.   The gallbladder was dissected from the liver bed in retrograde fashion with the electrocautery. The gallbladder was removed and placed in an Endocatch bag.  The gallbladder and Endocatch bag were then removed through the umbilical port site.  The liver bed was irrigated and inspected. Hemostasis was achieved with the electrocautery. Copious irrigation was utilized and was repeatedly aspirated until clear.    We  again inspected the right upper quadrant for hemostasis.  Pneumoperitoneum was released as we removed the trocars.   The pursestring suture was used to close the umbilical fascia.  4-0 Monocryl was used to close the skin.   The skin was cleaned and dry, and Dermabond was applied. The patient was then extubated and brought to the recovery room in stable condition. Instrument, sponge, and needle counts were correct at closure and at the conclusion of the case.   Findings: Beaded biliary tree.  See above.    Estimated Blood Loss: min         Drains: none          Specimens: Gallbladder to pathology       Complications: None; patient tolerated the procedure well.         Disposition: PACU - hemodynamically stable.         Condition: stable

## 2017-07-19 NOTE — Anesthesia Postprocedure Evaluation (Signed)
Anesthesia Post Note  Patient: Salote Weidmann  Procedure(s) Performed: LAPAROSCOPIC CHOLECYSTECTOMY WITH INTRAOPERATIVE CHOLANGIOGRAM (N/A Abdomen)     Patient location during evaluation: PACU Anesthesia Type: General Level of consciousness: awake, awake and alert and oriented Pain management: pain level controlled Vital Signs Assessment: post-procedure vital signs reviewed and stable Respiratory status: spontaneous breathing, nonlabored ventilation and respiratory function stable Cardiovascular status: blood pressure returned to baseline Anesthetic complications: no    Last Vitals:  Vitals:   07/19/17 1345 07/19/17 1400  BP:  110/68  Pulse: 65 77  Resp:    Temp:    SpO2: 98% 100%    Last Pain:  Vitals:   07/19/17 1400  TempSrc:   PainSc: 4                  Verleen Stuckey COKER

## 2017-07-19 NOTE — Interval H&P Note (Signed)
History and Physical Interval Note:  07/19/2017 10:07 AM  Joyce Hopkins  has presented today for surgery, with the diagnosis of BILIARY COLIC   The various methods of treatment have been discussed with the patient and family. After consideration of risks, benefits and other options for treatment, the patient has consented to  Procedure(s): LAPAROSCOPIC CHOLECYSTECTOMY WITH INTRAOPERATIVE CHOLANGIOGRAM (N/A) as a surgical intervention .  The patient's history has been reviewed, patient examined, no change in status, stable for surgery.  I have reviewed the patient's chart and labs.  Questions were answered to the patient's satisfaction.     Latangela Mccomas

## 2017-07-20 ENCOUNTER — Encounter (HOSPITAL_COMMUNITY): Payer: Self-pay | Admitting: General Surgery

## 2017-07-26 MED FILL — CITALOPRAM HBR 20 MG TABLET: 20 | 30 days supply | Qty: 30 | Fill #2

## 2017-07-27 NOTE — Progress Notes (Signed)
Patient needs a follow up here in two months. According to records at [facility], she had EUS with normal appearing ducts on that study. It appears that ERCP was not done. She was referred to get her gb out. They recommended repeat MRCP in two months. We can when to have colonoscopy at that time as well.

## 2017-07-30 ENCOUNTER — Encounter: Payer: Self-pay | Admitting: Gastroenterology

## 2017-07-30 NOTE — Progress Notes (Signed)
PATIENT SCHEDULED  °

## 2017-08-21 MED FILL — CITALOPRAM HBR 20 MG TABLET: 20 | 30 days supply | Qty: 30 | Fill #3

## 2017-08-23 ENCOUNTER — Telehealth: Payer: Self-pay | Admitting: Gastroenterology

## 2017-08-23 DIAGNOSIS — R945 Abnormal results of liver function studies: Secondary | ICD-10-CM

## 2017-08-23 NOTE — Telephone Encounter (Signed)
Please let patient know that I received records from [facility] from referral back in 05/2017. Reviewed records of gallbladder surgery and cholangiogram as well.    Based on initial MRI and on most recent cholangiogram, still suspicion for sclerosing cholangitis based on abnormal irregular and beaded appearance of the bile ducts.  Endoscopic ultrasound done at [facility] showed normal common bile duct.  According to the recommendations they want her to have a repeat MRCP this month.  If she is ready, let's arrange for MR abd MRCP W WO contrast and labs ([facility]). Orders placed.  Also needs colonoscopy especially if PSC confirmed.  Also needs ov with Dr. Oneida Alar to follow MRI and labs.

## 2017-08-24 NOTE — Telephone Encounter (Signed)
PT is aware and would like to wait until she sees the doctor again at [facility] on 09/20/2017. She would like a copy of the lab orders to take with her and see if she can have them done there.

## 2017-09-03 NOTE — Telephone Encounter (Signed)
Lab orders mailed to pt.

## 2017-09-03 NOTE — Telephone Encounter (Signed)
Noted. Ok for her to take our lab orders with her to her next [facility] appointment. I will await info from [facility].

## 2017-09-17 DIAGNOSIS — Z1231 Encounter for screening mammogram for malignant neoplasm of breast: Secondary | ICD-10-CM | POA: Diagnosis not present

## 2017-09-20 DIAGNOSIS — R748 Abnormal levels of other serum enzymes: Secondary | ICD-10-CM | POA: Diagnosis not present

## 2017-09-20 DIAGNOSIS — K828 Other specified diseases of gallbladder: Secondary | ICD-10-CM | POA: Diagnosis not present

## 2017-09-20 DIAGNOSIS — R932 Abnormal findings on diagnostic imaging of liver and biliary tract: Secondary | ICD-10-CM | POA: Diagnosis not present

## 2017-09-20 DIAGNOSIS — Z9049 Acquired absence of other specified parts of digestive tract: Secondary | ICD-10-CM | POA: Diagnosis not present

## 2017-09-26 ENCOUNTER — Ambulatory Visit: Payer: 59 | Admitting: Gastroenterology

## 2017-09-28 ENCOUNTER — Other Ambulatory Visit: Payer: Self-pay | Admitting: Pediatrics

## 2017-09-28 DIAGNOSIS — F419 Anxiety disorder, unspecified: Secondary | ICD-10-CM

## 2017-09-28 MED FILL — CITALOPRAM HBR 20 MG TABLET: 20 | 30 days supply | Qty: 30 | Fill #0

## 2017-10-29 ENCOUNTER — Other Ambulatory Visit: Payer: Self-pay | Admitting: Pediatrics

## 2017-10-29 DIAGNOSIS — F419 Anxiety disorder, unspecified: Secondary | ICD-10-CM

## 2017-10-29 MED FILL — CITALOPRAM HBR 20 MG TABLET: 20 | 30 days supply | Qty: 30 | Fill #0

## 2017-10-29 NOTE — Telephone Encounter (Signed)
Last seen 05/24/17   Joyce Hopkins

## 2017-11-29 MED FILL — CITALOPRAM HBR 20 MG TABLET: 20 | 30 days supply | Qty: 30 | Fill #1

## 2017-12-13 DIAGNOSIS — H52222 Regular astigmatism, left eye: Secondary | ICD-10-CM | POA: Diagnosis not present

## 2017-12-13 DIAGNOSIS — H524 Presbyopia: Secondary | ICD-10-CM | POA: Diagnosis not present

## 2017-12-13 DIAGNOSIS — H5203 Hypermetropia, bilateral: Secondary | ICD-10-CM | POA: Diagnosis not present

## 2017-12-21 MED FILL — PREDNISOLONE AC 1% EYE DROP: 1 | 50 days supply | Qty: 5 | Fill #0

## 2017-12-27 MED FILL — CITALOPRAM HBR 20 MG TABLET: 20 | 30 days supply | Qty: 30 | Fill #2

## 2018-01-30 MED FILL — CITALOPRAM HBR 20 MG TABLET: 20 | 30 days supply | Qty: 30 | Fill #3

## 2018-02-13 DIAGNOSIS — R932 Abnormal findings on diagnostic imaging of liver and biliary tract: Secondary | ICD-10-CM | POA: Diagnosis not present

## 2018-02-13 DIAGNOSIS — R740 Nonspecific elevation of levels of transaminase and lactic acid dehydrogenase [LDH]: Secondary | ICD-10-CM | POA: Diagnosis not present

## 2018-03-01 MED FILL — CITALOPRAM HBR 20 MG TABLET: 20 | 30 days supply | Qty: 30 | Fill #4

## 2018-03-06 ENCOUNTER — Ambulatory Visit (INDEPENDENT_AMBULATORY_CARE_PROVIDER_SITE_OTHER): Payer: 59 | Admitting: Pediatrics

## 2018-03-06 ENCOUNTER — Ambulatory Visit (INDEPENDENT_AMBULATORY_CARE_PROVIDER_SITE_OTHER): Payer: 59

## 2018-03-06 ENCOUNTER — Encounter: Payer: Self-pay | Admitting: Pediatrics

## 2018-03-06 VITALS — BP 105/70 | HR 66 | Temp 97.9°F | Ht 64.0 in | Wt 105.8 lb

## 2018-03-06 DIAGNOSIS — S6991XA Unspecified injury of right wrist, hand and finger(s), initial encounter: Secondary | ICD-10-CM | POA: Diagnosis not present

## 2018-03-06 DIAGNOSIS — M79641 Pain in right hand: Secondary | ICD-10-CM | POA: Diagnosis not present

## 2018-03-06 DIAGNOSIS — M7989 Other specified soft tissue disorders: Secondary | ICD-10-CM | POA: Diagnosis not present

## 2018-03-06 NOTE — Progress Notes (Signed)
  Subjective:   Patient ID: Joyce Hopkins, female    DOB: 02/17/1961, 57 y.o.   MRN: [id number] CC: Hand Pain (Right)  HPI: Joyce Hopkins is a 57 y.o. female   Injury 6/28, right hand was caught between [place] and dog.  Is been bothering her more today.  Can open and close her hand in a fist but it hurts.   Otherwise has been feeling well.  Due for follow-up MRCP in 6 months.  Following with gastroenterology.  Relevant past medical, surgical, family and social history reviewed. Allergies and medications reviewed and updated. Social History   Tobacco Use  Smoking Status Never Smoker  Smokeless Tobacco Never Used   ROS: Per HPI   Objective:    BP 105/70   Pulse 66   Temp 97.9 F (36.6 C) (Oral)   Ht 5\' 4"  (1.626 m)   Wt 105 lb 12.8 oz (48 kg)   BMI 18.16 kg/m   Wt Readings from Last 3 Encounters:  03/06/18 105 lb 12.8 oz (48 kg)  07/17/17 95 lb 14.4 oz (43.5 kg)  05/24/17 97 lb 12.8 oz (44.4 kg)    Gen: NAD, alert, cooperative with exam, NCAT EYES: EOMI, no conjunctival injection, or no icterus CV: distal pulses 2+ b/l Resp:  normal WOB Neuro: Alert and oriented MSK: Point tenderness halfway down third metacarpal.  Skin: Well-healing abrasion dorsum of right hand.  Assessment & Plan:  Joyce Hopkins was seen today for hand pain.  Diagnoses and all orders for this visit:  Injury of right hand, initial encounter No obvious fracture on x-ray, will f/u final read. Discussed symptomatic care. -     DG Hand Complete Right; Future  Tetanus today  Follow up plan: As needed [name], MD [place]

## 2018-04-02 ENCOUNTER — Other Ambulatory Visit: Payer: Self-pay | Admitting: Pediatrics

## 2018-04-02 DIAGNOSIS — F419 Anxiety disorder, unspecified: Secondary | ICD-10-CM

## 2018-04-03 MED FILL — CITALOPRAM HBR 20 MG TABLET: 20 | 30 days supply | Qty: 30 | Fill #0

## 2018-04-19 ENCOUNTER — Other Ambulatory Visit: Payer: Self-pay

## 2018-04-19 DIAGNOSIS — Z0000 Encounter for general adult medical examination without abnormal findings: Secondary | ICD-10-CM

## 2018-05-02 ENCOUNTER — Other Ambulatory Visit: Payer: Self-pay | Admitting: Pediatrics

## 2018-05-02 DIAGNOSIS — F419 Anxiety disorder, unspecified: Secondary | ICD-10-CM

## 2018-05-07 ENCOUNTER — Encounter: Payer: Self-pay | Admitting: *Deleted

## 2018-05-07 ENCOUNTER — Encounter: Payer: Self-pay | Admitting: Pediatrics

## 2018-05-07 ENCOUNTER — Ambulatory Visit (INDEPENDENT_AMBULATORY_CARE_PROVIDER_SITE_OTHER): Payer: 59 | Admitting: Pediatrics

## 2018-05-07 VITALS — BP 112/73 | HR 67 | Temp 97.2°F | Ht 64.0 in | Wt 109.6 lb

## 2018-05-07 DIAGNOSIS — F411 Generalized anxiety disorder: Secondary | ICD-10-CM | POA: Diagnosis not present

## 2018-05-07 DIAGNOSIS — R945 Abnormal results of liver function studies: Secondary | ICD-10-CM | POA: Diagnosis not present

## 2018-05-07 DIAGNOSIS — Z23 Encounter for immunization: Secondary | ICD-10-CM

## 2018-05-07 DIAGNOSIS — R7989 Other specified abnormal findings of blood chemistry: Secondary | ICD-10-CM

## 2018-05-07 MED ORDER — ALPRAZOLAM 0.5 MG PO TABS: 0.5000 mg | ORAL_TABLET | Freq: Every evening | ORAL | 0 refills | Status: DC | PRN

## 2018-05-07 MED ORDER — CITALOPRAM HYDROBROMIDE 20 MG PO TABS: 20.0000 mg | ORAL_TABLET | Freq: Every day | ORAL | 3 refills | Status: DC

## 2018-05-07 MED FILL — CITALOPRAM HBR 20 MG TABLET: 20 | 90 days supply | Qty: 90 | Fill #0

## 2018-05-07 MED FILL — ALPRAZolam 0.5 MG TABS: 0.5 | 30 days supply | Qty: 30 | Fill #0

## 2018-05-07 NOTE — Progress Notes (Signed)
  Subjective:   Patient ID: Joyce Hopkins, female    DOB: 1961-08-25, 57 y.o.   MRN: [id number] CC: Medical Management of Chronic Issues  HPI: Joyce Hopkins is a 56 y.o. female   Anxiety: improved on the citalopram. Sleeping well. Takes xanax at night rarely, less than weekly.  Primarily uses it when she travels.  Elevated LFTs: Follows with GI.  Has repeat MRI end of the year with them.  Before hepatitis A immunization.  Appetite has been fine, no abdominal pain.  Relevant past medical, surgical, family and social history reviewed. Allergies and medications reviewed and updated. Social History   Tobacco Use  Smoking Status Never Smoker  Smokeless Tobacco Never Used   ROS: Per HPI   Objective:    BP 112/73   Pulse 67   Temp (!) 97.2 F (36.2 C) (Oral)   Ht 5\' 4"  (1.626 m)   Wt 109 lb 9.6 oz (49.7 kg)   BMI 18.81 kg/m   Wt Readings from Last 3 Encounters:  05/07/18 109 lb 9.6 oz (49.7 kg)  03/06/18 105 lb 12.8 oz (48 kg)  07/17/17 95 lb 14.4 oz (43.5 kg)    Gen: NAD, alert, cooperative with exam, NCAT EYES: EOMI, no conjunctival injection, or no icterus ENT:  OP without erythema CV: NRRR, normal S1/S2, no murmur, distal pulses 2+ b/l  Resp: CTABL, no wheezes, normal WOB Ext: No edema, warm Neuro: Alert and oriented, strength equal b/l UE and LE, coordination grossly normal  Assessment & Plan:  Joyce Hopkins was seen today for medical management of chronic issues.  Diagnoses and all orders for this visit:  GAD (generalized anxiety disorder) Stable on below, continue. -     citalopram (CELEXA) 20 MG tablet; Take 1 tablet (20 mg total) by mouth daily. -     ALPRAZolam (XANAX) 0.5 MG tablet; Take 1 tablet (0.5 mg total) by mouth at bedtime as needed for anxiety or sleep.  Elevated LFTs  Improved.  S/p cholecystectomy.  Due for hepatitis A immunization.  Following with GI. -     Hepatitis A vaccine adult IM   Follow up plan: Return in about 1 year (around 05/08/2019). [name], MD [place]

## 2018-07-25 MED FILL — CITALOPRAM HBR 20 MG TABLET: 20 | 90 days supply | Qty: 90 | Fill #1

## 2018-08-15 DIAGNOSIS — R945 Abnormal results of liver function studies: Secondary | ICD-10-CM | POA: Diagnosis not present

## 2018-08-15 DIAGNOSIS — K831 Obstruction of bile duct: Secondary | ICD-10-CM | POA: Diagnosis not present

## 2018-08-15 DIAGNOSIS — R932 Abnormal findings on diagnostic imaging of liver and biliary tract: Secondary | ICD-10-CM | POA: Diagnosis not present

## 2018-08-15 DIAGNOSIS — K828 Other specified diseases of gallbladder: Secondary | ICD-10-CM | POA: Diagnosis not present

## 2018-08-15 DIAGNOSIS — K8020 Calculus of gallbladder without cholecystitis without obstruction: Secondary | ICD-10-CM | POA: Diagnosis not present

## 2018-08-15 DIAGNOSIS — R1013 Epigastric pain: Secondary | ICD-10-CM | POA: Diagnosis not present

## 2018-09-06 ENCOUNTER — Encounter: Payer: Self-pay | Admitting: Pediatrics

## 2018-09-06 ENCOUNTER — Ambulatory Visit (INDEPENDENT_AMBULATORY_CARE_PROVIDER_SITE_OTHER): Payer: 59 | Admitting: Pediatrics

## 2018-09-06 ENCOUNTER — Ambulatory Visit: Payer: 59

## 2018-09-06 VITALS — BP 106/65 | HR 90 | Temp 97.7°F | Ht 64.0 in | Wt 110.6 lb

## 2018-09-06 DIAGNOSIS — J069 Acute upper respiratory infection, unspecified: Secondary | ICD-10-CM | POA: Diagnosis not present

## 2018-09-06 DIAGNOSIS — J029 Acute pharyngitis, unspecified: Secondary | ICD-10-CM

## 2018-09-06 LAB — RAPID STREP SCREEN (MED CTR MEBANE ONLY): Strep Gp A Ag, IA W/Reflex: NEGATIVE

## 2018-09-06 LAB — CULTURE, GROUP A STREP

## 2018-09-06 NOTE — Patient Instructions (Signed)
Fever reducer and headache: tylenol and ibuprofen, can take together or alternating   Sinus pressure:  Nasal steroid such as flonase/fluticaone or nasocort daily Can also take daily antihistamine such as loratadine/claritin or cetirizine/zyrtec Decongestant such as phenylephrine, but take in the morning or may keep you awake at night  Sinus rinses/irritation: Netipot or similar with distilled water 2-3 times a day to clear out sinuses or Normal saline nasal spray  Sore throat:  Throat lozenges chloroseptic spray  Stick with bland foods Drink lots of fluids

## 2018-09-06 NOTE — Progress Notes (Signed)
  Subjective:   Patient ID: Joyce Hopkins, female    DOB: [birth year], 58 y.o.   MRN: [id number] CC: Sore Throat (3 days) and Nasal Congestion  HPI: Joyce Hopkins is a 57 y.o. female   Sore throat started 3 days ago.  Throat has been very sore, with every swallow, bracing herself.  Some nasal congestion, small amount of cough.  No fevers.  Appetite has been okay.  Relevant past medical, surgical, family and social history reviewed. Allergies and medications reviewed and updated. Social History   Tobacco Use  Smoking Status Never Smoker  Smokeless Tobacco Never Used   ROS: Per HPI   Objective:    BP 106/65   Pulse 90   Temp 97.7 F (36.5 C) (Oral)   Ht 5\' 4"  (1.626 m)   Wt 110 lb 9.6 oz (50.2 kg)   BMI 18.98 kg/m   Wt Readings from Last 3 Encounters:  09/06/18 110 lb 9.6 oz (50.2 kg)  05/07/18 109 lb 9.6 oz (49.7 kg)  03/06/18 105 lb 12.8 oz (48 kg)    Gen: NAD, alert, cooperative with exam, NCAT EYES: EOMI, no conjunctival injection, or no icterus ENT:  TMs pearly gray b/l with small amount layering white effusion, OP with mild erythema LYMPH: <0.5 cm cervical LAD CV: NRRR, normal S1/S2, no murmur, distal pulses 2+ b/l Resp: CTABL, no wheezes, normal WOB Ext: No edema, warm Neuro: Alert and oriented, strength equal b/l UE and LE, coordination grossly normal MSK: normal muscle bulk  Assessment & Plan:  [name] was seen today for sore throat and nasal congestion.  Diagnoses and all orders for this visit:  Acute URI Symptom care discussed.  Sore throat Rapid strep negative.  Return precautions discussed.  We will follow-up culture. -     Rapid Strep Screen ([facility] ONLY) -     Culture, Group A Strep  Other orders -     Culture, Group A Strep   Follow up plan: No follow-ups on file. [name], MD [place]

## 2018-09-08 LAB — CULTURE, GROUP A STREP: STREP A CULTURE: NEGATIVE

## 2018-09-18 ENCOUNTER — Ambulatory Visit (INDEPENDENT_AMBULATORY_CARE_PROVIDER_SITE_OTHER): Payer: 59 | Admitting: Pediatrics

## 2018-09-18 ENCOUNTER — Encounter: Payer: Self-pay | Admitting: Pediatrics

## 2018-09-18 VITALS — BP 108/65 | HR 76 | Temp 97.5°F | Ht 64.0 in | Wt 111.0 lb

## 2018-09-18 DIAGNOSIS — R197 Diarrhea, unspecified: Secondary | ICD-10-CM

## 2018-09-18 NOTE — Progress Notes (Signed)
  Subjective:   Patient ID: Joyce Hopkins, female    DOB: 1960/10/13, 58 y.o.   MRN: [id number] CC: Diarrhea (For 11 days. Seems better today)  HPI: Joyce Hopkins is a 58 y.o. female   Diarrhea started the afternoon after being seen for URI symptoms 11 days ago. Having 6-9 episodes of diarrhea daily for several days.  Got better for a few days.  Ate [employer] for dinner 4 nights ago, diarrhea return the next day similar 6-9 times a day.  Does seem to be clustered after eating.  Is able to make it to the bathroom.  A few times has been watery but mostly just loose stools.  Mostly normal brown color.  No blood in stools.  Appetite is now normal.  Feeling hungry regularly.  Sometimes has stomach " churning" but no abdominal pain.  No nausea or vomiting.  No fevers.  S/p elective cholecystectomy in 07/2017.  Relevant past medical, surgical, family and social history reviewed. Allergies and medications reviewed and updated. Social History   Tobacco Use  Smoking Status Never Smoker  Smokeless Tobacco Never Used   ROS: Per HPI   Objective:    BP 108/65   Pulse 76   Temp (!) 97.5 F (36.4 C) (Oral)   Ht 5\' 4"  (1.626 m)   Wt 111 lb (50.3 kg)   BMI 19.05 kg/m   Wt Readings from Last 3 Encounters:  09/18/18 111 lb (50.3 kg)  09/06/18 110 lb 9.6 oz (50.2 kg)  05/07/18 109 lb 9.6 oz (49.7 kg)    Gen: NAD, alert, cooperative with exam, NCAT EYES: EOMI, no conjunctival injection, or no icterus CV: NRRR, normal S1/S2, no murmur, distal pulses 2+ b/l Resp: CTABL, no wheezes, normal WOB Abd: +BS, soft, mildly uncomfortable with palpation throughout. ND. no guarding or organomegaly Ext: No edema, warm Neuro: Alert and oriented, strength equal b/l UE and LE, coordination grossly normal MSK: normal muscle bulk  Assessment & Plan:  Jovan was seen today for diarrhea.  Diagnoses and all orders for this visit:  Diarrhea, unspecified type Stick with bland foods for the next few days.  Keep up  hydration.  Let me know if watery diarrhea returns, blood in stools.  Symptom care and return precautions discussed.  Follow up plan: Return if symptoms worsen or fail to improve. [name], MD [place]

## 2018-09-29 ENCOUNTER — Ambulatory Visit: Payer: Self-pay | Admitting: Nurse Practitioner

## 2018-09-29 VITALS — BP 116/70 | HR 94 | Temp 98.0°F | Resp 18 | Wt 108.4 lb

## 2018-09-29 DIAGNOSIS — J069 Acute upper respiratory infection, unspecified: Secondary | ICD-10-CM

## 2018-09-29 MED ORDER — PROMETHAZINE-DM 6.25-15 MG/5ML PO SYRP: 5.0000 mL | ORAL_SOLUTION | Freq: Four times a day (QID) | ORAL | 0 refills | Status: AC | PRN

## 2018-09-29 MED ORDER — AZITHROMYCIN 250 MG PO TABS: ORAL_TABLET | ORAL | 0 refills | Status: DC

## 2018-09-29 NOTE — Patient Instructions (Signed)
Upper Respiratory Infection, Adult -Take medication as prescribed. -Ibuprofen or Tylenol for pain, fever, or general discomfort. -Increase fluids. -Sleep elevated on at least 2 pillows at bedtime to help with cough. -Use a humidifier or vaporizer when at home and during sleep to help with cough. -May use a teaspoon of honey or over-the-counter cough drops to help with cough. -Follow-up in the emergency department if you have worsening shortness of breath, difficulty breathing, or other concerns.  Otherwise, follow-up with your primary care physician in the next 5 to 7 days if symptoms are not improving.   An upper respiratory infection (URI) is a common viral infection of the nose, throat, and upper air passages that lead to the lungs. The most common type of URI is the common cold. URIs usually get better on their own, without medical treatment. What are the causes? A URI is caused by a virus. You may catch a virus by:  Breathing in droplets from an infected person's cough or sneeze.  Touching something that has been exposed to the virus (contaminated) and then touching your mouth, nose, or eyes. What increases the risk? You are more likely to get a URI if:  You are very young or very old.  It is autumn or winter.  You have close contact with others, such as at a daycare, school, or health care facility.  You smoke.  You have long-term (chronic) heart or lung disease.  You have a weakened disease-fighting (immune) system.  You have nasal allergies or asthma.  You are experiencing a lot of stress.  You work in an area that has poor air circulation.  You have poor nutrition. What are the signs or symptoms? A URI usually involves some of the following symptoms:  Runny or stuffy (congested) nose.  Sneezing.  Cough.  Sore throat.  Headache.  Fatigue.  Fever.  Loss of appetite.  Pain in your forehead, behind your eyes, and over your cheekbones (sinus  pain).  Muscle aches.  Redness or irritation of the eyes.  Pressure in the ears or face. How is this diagnosed? This condition may be diagnosed based on your medical history and symptoms, and a physical exam. Your health care provider may use a cotton swab to take a mucus sample from your nose (nasal swab). This sample can be tested to determine what virus is causing the illness. How is this treated? URIs usually get better on their own within 7-10 days. You can take steps at home to relieve your symptoms. Medicines cannot cure URIs, but your health care provider may recommend certain medicines to help relieve symptoms, such as:  Over-the-counter cold medicines.  Cough suppressants. Coughing is a type of defense against infection that helps to clear the respiratory system, so take these medicines only as recommended by your health care provider.  Fever-reducing medicines. Follow these instructions at home: Activity  Rest as needed.  If you have a fever, stay home from work or school until your fever is gone or until your health care provider says you are no longer contagious. Your health care provider may have you wear a face mask to prevent your infection from spreading. Relieving symptoms  Gargle with a salt-water mixture 3-4 times a day or as needed. To make a salt-water mixture, completely dissolve -1 tsp of salt in 1 cup of warm water.  Use a cool-mist humidifier to add moisture to the air. This can help you breathe more easily. Eating and drinking   Drink enough  fluid to keep your urine pale yellow.  Eat soups and other clear broths. General instructions   Take over-the-counter and prescription medicines only as told by your health care provider. These include cold medicines, fever reducers, and cough suppressants.  Do not use any products that contain nicotine or tobacco, such as cigarettes and e-cigarettes. If you need help quitting, ask your health care  provider.  Stay away from secondhand smoke.  Stay up to date on all immunizations, including the yearly (annual) flu vaccine.  Keep all follow-up visits as told by your health care provider. This is important. How to prevent the spread of infection to others   URIs can be passed from person to person (are contagious). To prevent the infection from spreading: ? Wash your hands often with soap and water. If soap and water are not available, use hand sanitizer. ? Avoid touching your mouth, face, eyes, or nose. ? Cough or sneeze into a tissue or your sleeve or elbow instead of into your hand or into the air. Contact a health care provider if:  You are getting worse instead of better.  You have a fever or chills.  Your mucus is brown or red.  You have yellow or brown discharge coming from your nose.  You have pain in your face, especially when you bend forward.  You have swollen neck glands.  You have pain while swallowing.  You have white areas in the back of your throat. Get help right away if:  You have shortness of breath that gets worse.  You have severe or persistent: ? Headache. ? Ear pain. ? Sinus pain. ? Chest pain.  You have chronic lung disease along with any of the following: ? Wheezing. ? Prolonged cough. ? Coughing up blood. ? A change in your usual mucus.  You have a stiff neck.  You have changes in your: ? Vision. ? Hearing. ? Thinking. ? Mood. Summary  An upper respiratory infection (URI) is a common infection of the nose, throat, and upper air passages that lead to the lungs.  A URI is caused by a virus.  URIs usually get better on their own within 7-10 days.  Medicines cannot cure URIs, but your health care provider may recommend certain medicines to help relieve symptoms. This information is not intended to replace advice given to you by your health care provider. Make sure you discuss any questions you have with your health care  provider. Document Released: 02/21/2001 Document Revised: 04/13/2017 Document Reviewed: 04/13/2017 Elsevier Interactive Patient Education  2019 [employer].

## 2018-09-29 NOTE — Progress Notes (Signed)
Subjective:    Patient ID: Joyce Hopkins, female    DOB: [birth year], 59 y.o.   MRN: [id number]  Patient is a 58 year old female who presents today for continued complaints of flulike symptoms.  Specifically the patient states she has been having continued fevers and a continued cough.  Patient states now it seems like he is having a more difficult time taking a deep breath in or out.  Patient also states that she is more "winded" than usual.  Patient has been taking over-the-counter medications for her flulike symptoms.  Patient states her flu-like symptoms have improved, but the cough and fever have remain.  Patient states she only gets the fever at nighttime, with her fever getting as high as 102.  Patient also informs she has been laying around all week.  Patient states that she does use an inhaler, although she has never been officially diagnosed with asthma.  Patient states she has used her inhaler several times over the past week.  Cough  This is a new problem. The current episode started in the past 7 days. The problem has been gradually worsening. Associated symptoms include a fever, shortness of breath, sweats and wheezing. Pertinent negatives include no chest pain, chills, ear congestion, ear pain or sore throat. Nothing aggravates the symptoms. She has tried OTC cough suppressant for the symptoms. The treatment provided mild relief. Her past medical history is significant for asthma. There is no history of COPD, emphysema or pneumonia.   Past Medical History:  Diagnosis Date  . Allergy   . Biliary colic   . GERD (gastroesophageal reflux disease)     Review of Systems  Constitutional: Positive for activity change, appetite change, fatigue and fever. Negative for chills.  HENT: Negative for congestion, ear pain, sinus pressure, sinus pain and sore throat.   Respiratory: Positive for cough, shortness of breath and wheezing.   Cardiovascular: Negative.  Negative for chest pain.   Gastrointestinal: Negative.   Skin: Negative.        Objective: Blood pressure 116/70, pulse 94, temperature 98 F (36.7 C), temperature source Oral, resp. rate 18, weight 108 lb 6.4 oz (49.2 kg), SpO2 99 %.    Physical Exam Vitals signs reviewed.  Constitutional:      General: She is not in acute distress.    Appearance: Normal appearance.  HENT:     Head: Normocephalic.     Right Ear: Tympanic membrane and ear canal normal.     Left Ear: Tympanic membrane and ear canal normal.     Nose: Nose normal.     Mouth/Throat:     Pharynx: No posterior oropharyngeal erythema.  Neck:     Musculoskeletal: Normal range of motion and neck supple. No neck rigidity or muscular tenderness.  Cardiovascular:     Rate and Rhythm: Normal rate.     Pulses: Normal pulses.     Heart sounds: Normal heart sounds.  Pulmonary:     Effort: Pulmonary effort is normal. No respiratory distress.     Breath sounds: No wheezing, rhonchi or rales.     Comments: No adventitious breath sounds heard on physical exam.  Unable to perform a chest x-ray in our office. Chest:     Chest [place]: No tenderness.  Abdominal:     General: Abdomen is flat. There is no distension.     Tenderness: There is no abdominal tenderness.  Lymphadenopathy:     Cervical: No cervical adenopathy.  Skin:    General: Skin is  warm and dry.     Capillary Refill: Capillary refill takes less than 2 seconds.  Neurological:     General: No focal deficit present.     Mental Status: She is alert and oriented to person, place, and time.     Cranial Nerves: No cranial nerve deficit.  Psychiatric:        Mood and Affect: Mood normal.        Thought Content: Thought content normal.       Assessment & Plan:   Exam findings, diagnosis etiology and medication use and indications reviewed with patient. Follow- Up and discharge instructions provided. No emergent/urgent issues found on exam.  I am concerned for the patient and risk for  pneumonia.  What patient describes was influenza-like symptoms prior to the worsening of the cough and fever.  Patient also has underlying history of asthma for which I am also concerned.  Discussed with patient and she was in agreement with starting azithromycin which would cover her for pneumonia.  I feel trouble discharging the patient to home as she is in no acute distress, and vital signs are stable.  Patient education was provided. Patient verbalized understanding of information provided and agrees with plan of care (POC), all questions answered. The patient is advised to call or return to clinic if condition does not see an improvement in symptoms, or to seek the care of the closest emergency department if condition worsens with the above plan.

## 2018-09-30 MED FILL — PROMETHAZINE W/DM SYRUP: 6.25-15 | 7 days supply | Qty: 140 | Fill #0

## 2018-10-11 ENCOUNTER — Ambulatory Visit (INDEPENDENT_AMBULATORY_CARE_PROVIDER_SITE_OTHER): Payer: 59 | Admitting: Family Medicine

## 2018-10-11 ENCOUNTER — Encounter: Payer: Self-pay | Admitting: Family Medicine

## 2018-10-11 VITALS — BP 116/65 | HR 83 | Temp 96.9°F | Ht 64.0 in | Wt 109.4 lb

## 2018-10-11 DIAGNOSIS — R05 Cough: Secondary | ICD-10-CM

## 2018-10-11 DIAGNOSIS — R053 Chronic cough: Secondary | ICD-10-CM

## 2018-10-11 MED ORDER — BETAMETHASONE SOD PHOS & ACET 6 (3-3) MG/ML IJ SUSP
6.0000 mg | Freq: Once | INTRAMUSCULAR | Status: AC
  Administered 2018-10-11: 6 mg via INTRAMUSCULAR

## 2018-10-11 MED ORDER — BENZONATATE 200 MG PO CAPS: 200.0000 mg | ORAL_CAPSULE | Freq: Three times a day (TID) | ORAL | 0 refills | Status: DC | PRN

## 2018-10-11 NOTE — Progress Notes (Signed)
Chief Complaint  Patient presents with  . Cough    x 3 weeks. [facility] and did zpack and cough medicine and cough is still there.  . Shortness of Breath    HPI  Patient presents today for continued cough after 3 weeks. Took zpack and cough med, sx resolved except the cough. She will take a deep breath and feel like it is getting cut off. Then she coughs and it breaks up.  No fever chills or sweats.  Cough is dry.  She is not short of breath routinely but when she tries to take a deep breath as noted above.  She does have an inhaler she is used in the past and it seems to make the cough worse right now.  Post bronchitic cough  PMH: Smoking status noted ROS: Per HPI  Objective: BP 116/65   Pulse 83   Temp (!) 96.9 F (36.1 C) (Oral)   Ht 5\' 4"  (1.626 m)   Wt 109 lb 6.4 oz (49.6 kg)   SpO2 97%   BMI 18.78 kg/m  Gen: NAD, alert, cooperative with exam HEENT: NCAT, EOMI, PERRL CV: RRR, good S1/S2, no murmur Resp: CTABL, no wheezes, non-labored Abd: SNTND, BS present, no guarding or organomegaly Ext: No edema, warm Neuro: Alert and oriented, No gross deficits  Assessment and plan:  1. Cough, persistent     Meds ordered this encounter  Medications  . benzonatate (TESSALON) 200 MG capsule    Sig: Take 1 capsule (200 mg total) by mouth 3 (three) times daily as needed for cough.    Dispense:  20 capsule    Refill:  0  . betamethasone acetate-betamethasone sodium phosphate (CELESTONE) injection 6 mg    No orders of the defined types were placed in this encounter.   Follow up as needed.  Claretta Fraise, MD

## 2018-10-22 DIAGNOSIS — H04123 Dry eye syndrome of bilateral lacrimal glands: Secondary | ICD-10-CM | POA: Diagnosis not present

## 2018-10-22 DIAGNOSIS — H52222 Regular astigmatism, left eye: Secondary | ICD-10-CM | POA: Diagnosis not present

## 2018-10-22 DIAGNOSIS — H524 Presbyopia: Secondary | ICD-10-CM | POA: Diagnosis not present

## 2018-10-22 DIAGNOSIS — H5203 Hypermetropia, bilateral: Secondary | ICD-10-CM | POA: Diagnosis not present

## 2018-11-03 MED FILL — CITALOPRAM HBR 20 MG TABLET: 20 | 90 days supply | Qty: 90 | Fill #2

## 2018-11-07 ENCOUNTER — Ambulatory Visit (INDEPENDENT_AMBULATORY_CARE_PROVIDER_SITE_OTHER): Payer: 59 | Admitting: *Deleted

## 2018-11-07 DIAGNOSIS — Z23 Encounter for immunization: Secondary | ICD-10-CM

## 2018-11-07 NOTE — Patient Instructions (Signed)
Hepatitis A Vaccine, Inactivated suspension for injection  What is this medicine?  HEPATITIS A VACCINE (hep uh TAHY tis A VAK seen) is a vaccine to protect from an infection with the hepatitis A virus. This vaccine does not contain the live virus. It will not cause a hepatitis infection. This vaccine is also used with immunoglobulin to prevent infection in people who have been exposed to hepatitis A.  This medicine may be used for other purposes; ask your health care provider or pharmacist if you have questions.  COMMON BRAND NAME(S): Havrix, Vaqta  What should I tell my health care provider before I take this medicine?  They need to know if you have any of these conditions:  -bleeding disorder  -fever or infection  -heart disease  -immune system problems  -an unusual or allergic reaction to hepatitis A vaccine, latex, neomycin, other medicines, foods, dyes, or preservatives  -pregnant or trying to get pregnant  -breast-feeding  How should I use this medicine?  This vaccine is for injection into a muscle. It is given by a health care professional.  A copy of Vaccine Information Statements will be given before each vaccination. Read this sheet carefully each time. The sheet may change frequently.  Talk to your pediatrician regarding the use of this medicine in children. While this drug may be prescribed for children as young as 12 months of age for selected conditions, precautions do apply.  Overdosage: If you think you have taken too much of this medicine contact a poison control center or emergency room at once.  NOTE: This medicine is only for you. Do not share this medicine with others.  What if I miss a dose?  This does not apply.  What may interact with this medicine?  -medicines to treat cancer  -medicines that suppress your immune function like adalimumab, anakinra, etanercept, infliximab  -steroid medicines like prednisone or cortisone  This list may not describe all possible interactions. Give your health  care provider a list of all the medicines, herbs, non-prescription drugs, or dietary supplements you use. Also tell them if you smoke, drink alcohol, or use illegal drugs. Some items may interact with your medicine.  What should I watch for while using this medicine?  See your health care provider for a booster shot of this vaccine as directed. Tell your doctor right away if you have any serious or unusual side effects after getting this vaccine.  You will not have protection from the hepatitis A virus for at least 8 to 10 days after your first injection. The length of time you will have protection from hepatitis A virus infection is not known. Check with your doctor if you have questions about your immunity. See your doctor before you travel out of the country.  What side effects may I notice from receiving this medicine?  Side effects that you should report to your doctor or health care professional as soon as possible:  -allergic reactions like skin rash, itching or hives, swelling of the face, lips, or tongue  -breathing problems  -seizures  -yellowing of the eyes or skin  Side effects that usually do not require medical attention (report to your doctor or health care professional if they continue or are bothersome):  -diarrhea  -fever  -loss of appetite  -muscle pain  -nausea  -pain, redness, swelling or irritation at site where injected  -tiredness  This list may not describe all possible side effects. Call your doctor for medical advice about side   effects. You may report side effects to FDA at 1-800-FDA-1088.  Where should I keep my medicine?  This drug is given in a hospital or clinic and will not be stored at home.  NOTE: This sheet is a summary. It may not cover all possible information. If you have questions about this medicine, talk to your doctor, pharmacist, or health care provider.   2019 Elsevier/Gold Standard (2013-12-29 13:19:40)

## 2018-11-07 NOTE — Progress Notes (Signed)
Pt given 2nd Hep A vaccine Tolerated well 

## 2019-02-02 MED FILL — CITALOPRAM HBR 20 MG TABLET: 20 | 90 days supply | Qty: 90 | Fill #3

## 2019-03-11 ENCOUNTER — Other Ambulatory Visit: Payer: Self-pay | Admitting: *Deleted

## 2019-03-11 DIAGNOSIS — F411 Generalized anxiety disorder: Secondary | ICD-10-CM

## 2019-03-11 NOTE — Telephone Encounter (Signed)
Pt. Needs to be seen for this. Thanks, WS 

## 2019-03-11 NOTE — Telephone Encounter (Signed)
Stacks, pt last seen 10/11/18 Will need a OV after this RX.

## 2019-03-15 IMAGING — US US ABDOMEN LIMITED
1 series · 14 of 25 positions shown · non-contrast
Comparison: None.

CLINICAL DATA: Right upper quadrant pain for 3 weeks

EXAM:
ULTRASOUND ABDOMEN LIMITED RIGHT UPPER QUADRANT

[Series 1: us abdomen limited · 0.15mm/px · 14 of 75 slices shown]
[im 1/75]
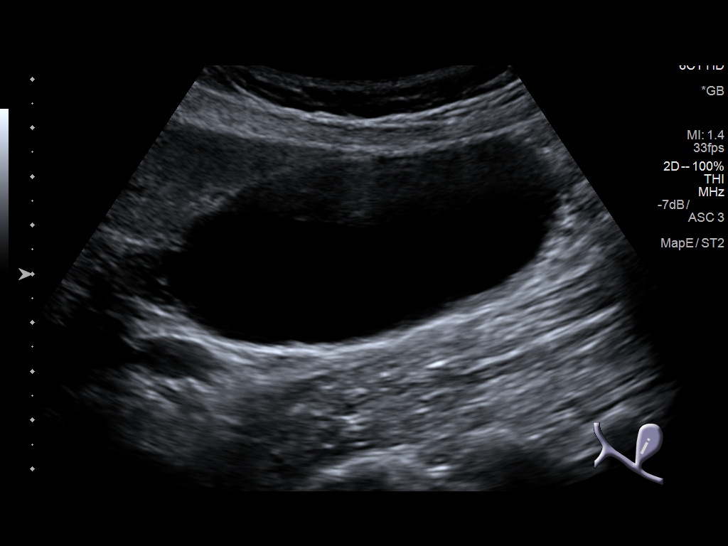
[im 7/75]
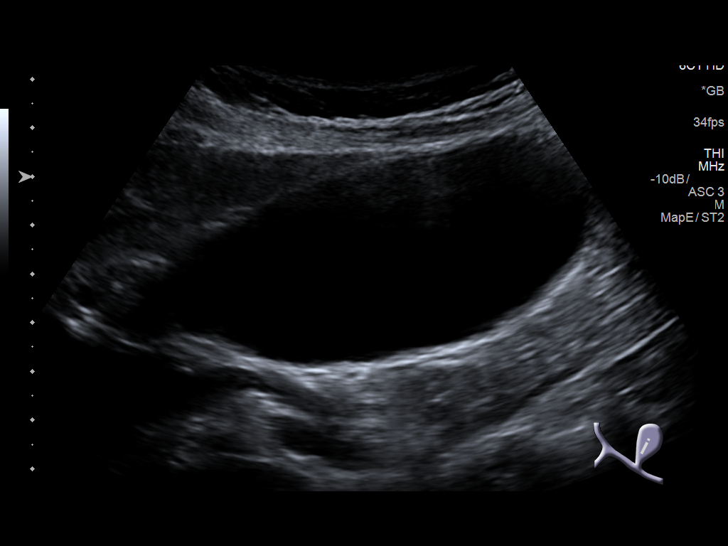
[im 13/75]
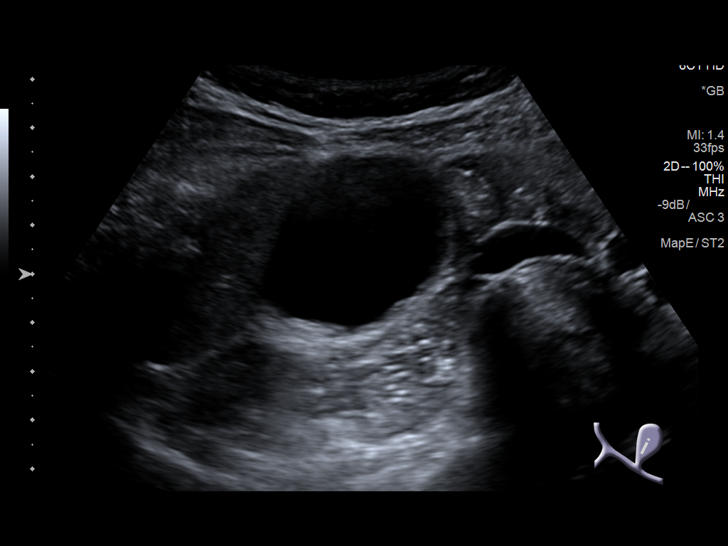
[im 19/75]
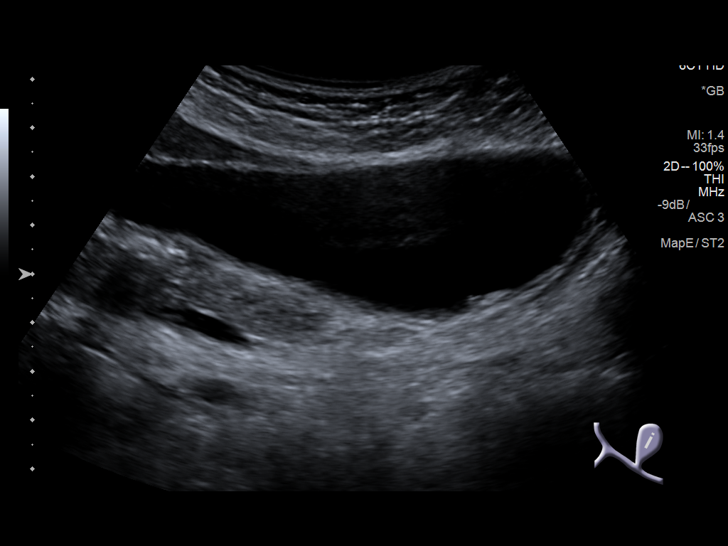
[im 25/75]
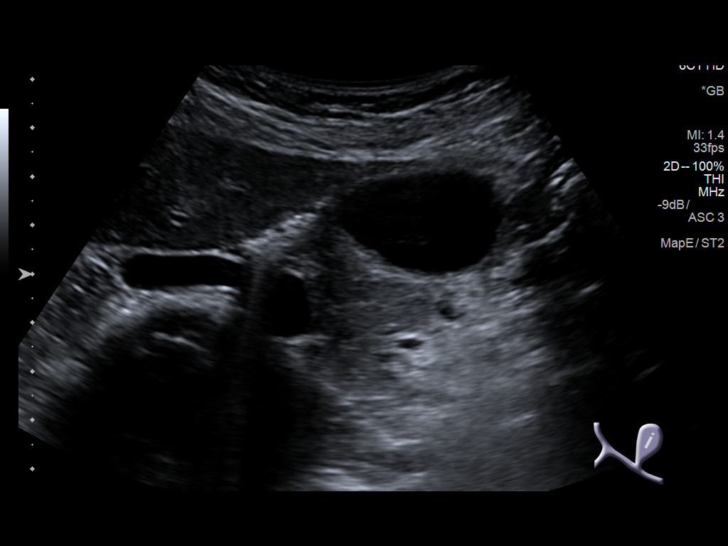
[im 28/75]
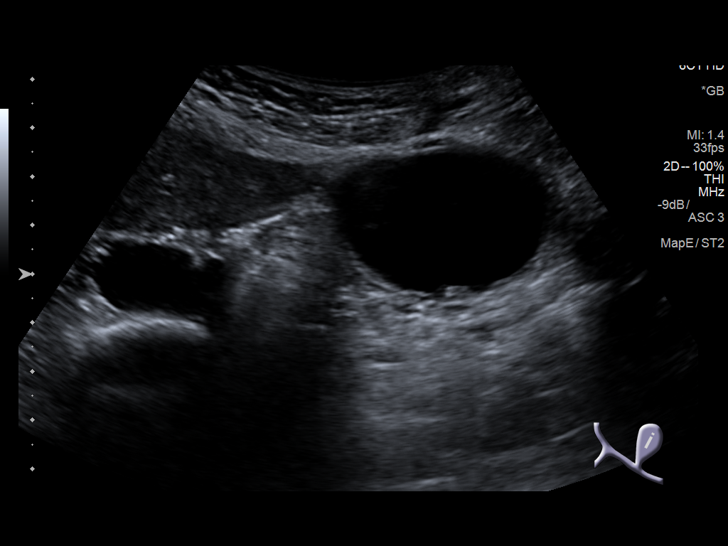
[im 34/75]
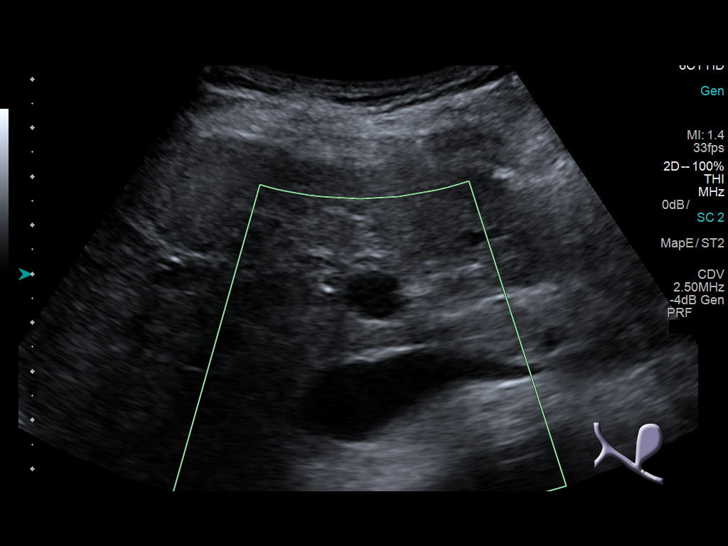
[im 41/75]
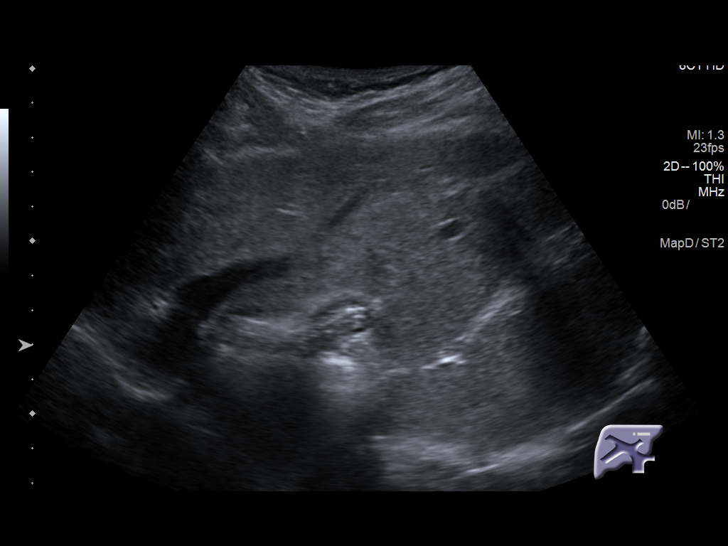
[im 47/75]
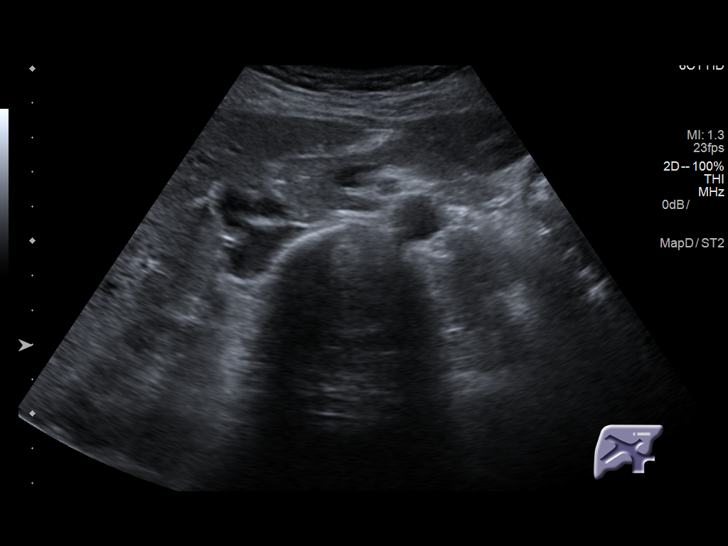
[im 50/75]
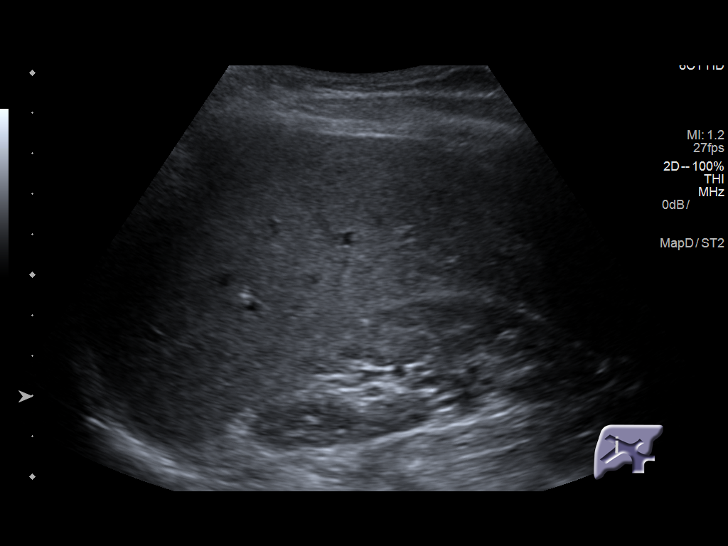
[im 56/75]
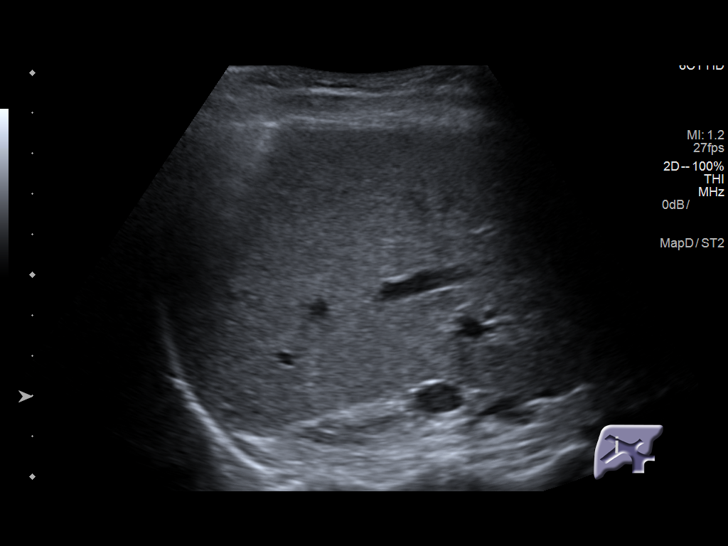
[im 62/75]
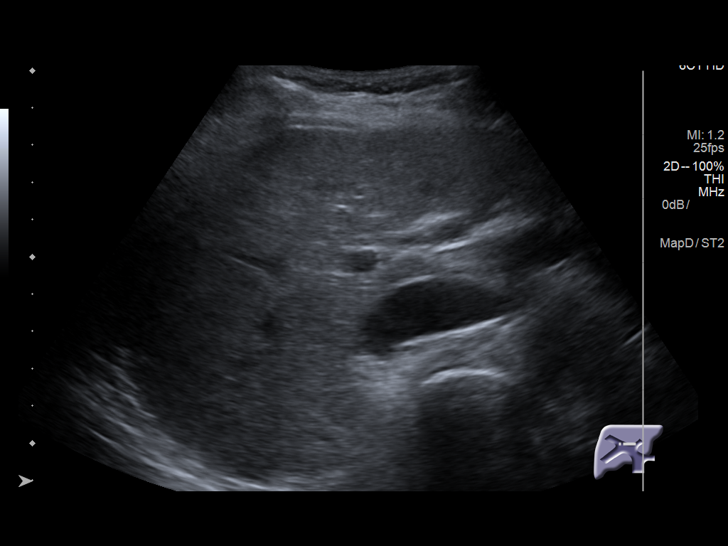
[im 68/75]
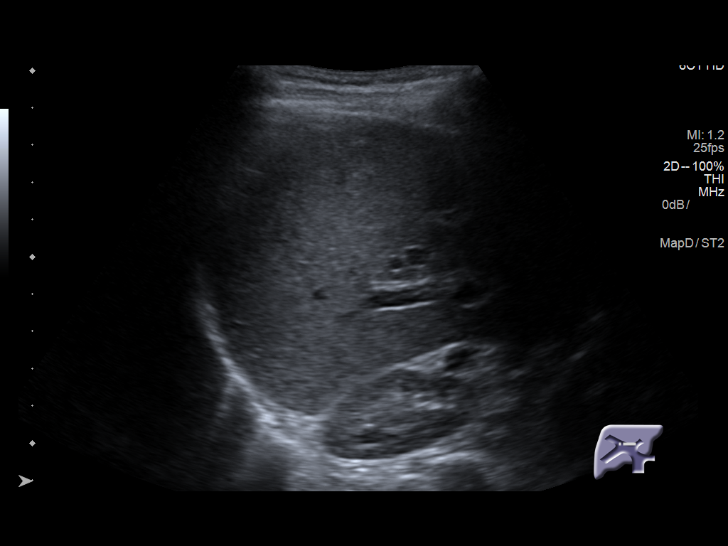
[im 75/75]
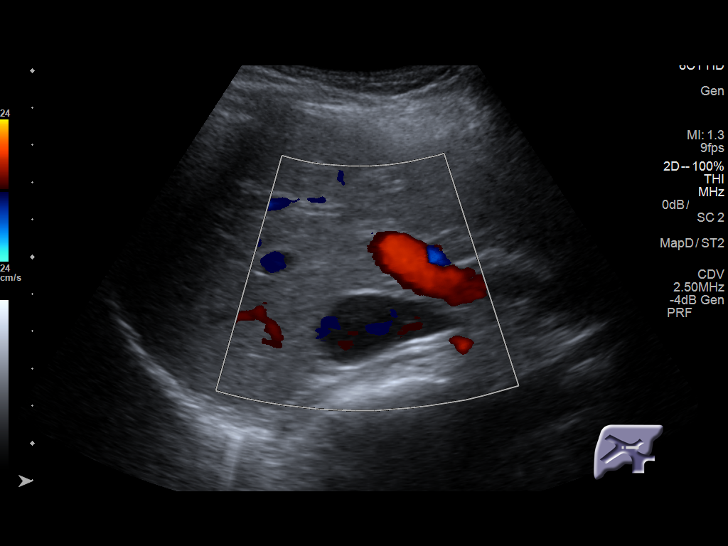

[14 of 25 positions shown; findings below may reference images not displayed]

FINDINGS: Gallbladder:

Gallbladder is mildly distended with areas of sludge within the
gallbladder. There are no echogenic foci appreciable which move and
shadow within the gallbladder as is expected with gallstones. There
is no gallbladder wall thickening or pericholecystic fluid. No
sonographic Murphy sign noted by sonographer.

Common bile duct:

Diameter: 2 mm. No intrahepatic or extrahepatic biliary duct
dilatation.

Liver:

No focal lesion identified. Within normal limits in parenchymal
echogenicity.
IMPRESSION: Gallbladder mildly distended with an mild amount of sludge noted in
the gallbladder. No gallstones evident. No gallbladder wall
thickening or pericholecystic fluid.

Study otherwise unremarkable.

## 2019-03-17 NOTE — Telephone Encounter (Signed)
Has appt on 7/20 with Lajuana Ripple

## 2019-03-25 ENCOUNTER — Other Ambulatory Visit: Payer: Self-pay | Admitting: *Deleted

## 2019-03-25 DIAGNOSIS — F411 Generalized anxiety disorder: Secondary | ICD-10-CM

## 2019-03-28 ENCOUNTER — Other Ambulatory Visit: Payer: Self-pay

## 2019-03-31 ENCOUNTER — Ambulatory Visit (INDEPENDENT_AMBULATORY_CARE_PROVIDER_SITE_OTHER): Payer: 59 | Admitting: Family Medicine

## 2019-03-31 ENCOUNTER — Encounter: Payer: Self-pay | Admitting: Family Medicine

## 2019-03-31 ENCOUNTER — Other Ambulatory Visit: Payer: Self-pay

## 2019-03-31 VITALS — BP 116/75 | HR 68 | Temp 98.4°F | Ht 64.0 in | Wt 111.0 lb

## 2019-03-31 DIAGNOSIS — F418 Other specified anxiety disorders: Secondary | ICD-10-CM | POA: Diagnosis not present

## 2019-03-31 DIAGNOSIS — F411 Generalized anxiety disorder: Secondary | ICD-10-CM | POA: Diagnosis not present

## 2019-03-31 DIAGNOSIS — Z130 Encounter for screening for diseases of the blood and blood-forming organs and certain disorders involving the immune mechanism: Secondary | ICD-10-CM | POA: Diagnosis not present

## 2019-03-31 DIAGNOSIS — Z79899 Other long term (current) drug therapy: Secondary | ICD-10-CM | POA: Diagnosis not present

## 2019-03-31 MED ORDER — ALPRAZOLAM 0.5 MG PO TABS: 0.5000 mg | ORAL_TABLET | Freq: Every evening | ORAL | 1 refills | Status: DC | PRN

## 2019-03-31 MED ORDER — CITALOPRAM HYDROBROMIDE 20 MG PO TABS: 20.0000 mg | ORAL_TABLET | Freq: Every day | ORAL | 3 refills | Status: DC

## 2019-03-31 MED FILL — ALPRAZolam 0.5 MG TABS: 0.5 | 30 days supply | Qty: 30 | Fill #0

## 2019-03-31 NOTE — Progress Notes (Signed)
Subjective: CC: Generalized anxiety disorder follow-up PCP: [name], DO TXH:FSFSE Joyce Hopkins is a 58 y.o. female presenting to clinic today for:  1.  Anxiety disorder Patient reports well-controlled generalized anxiety disorder with Celexa 20 mg daily.  She uses alprazolam very rarely but notes that she does find it needed during certain situations like traveling.  She has a claustrophobic almost trapped sensation when she is riding in vehicles.  She has never been hospitalized for mental health disorder.  No substance abuse.  Family history notable for some depressive disorder in siblings but nothing severe.  She works as needed in a Proofreader.  Originally from [place].  Her husband is from the area.  2.  Asthma Patient has uncomplicated asthma that is controlled by as needed use of albuterol.  No refills needed at this time.  No shortness of breath or wheezing.  ROS: Per HPI  Allergies  Allergen Reactions  . Acetaminophen Anaphylaxis and Other (See Comments)    Wheezing/hives  . Levofloxacin Other (See Comments)    Muscle spasms   Past Medical History:  Diagnosis Date  . Allergy   . Biliary colic   . GERD (gastroesophageal reflux disease)     Current Outpatient Medications:  .  albuterol (PROAIR HFA) 108 (90 Base) MCG/ACT inhaler, Inhale 2 puffs into the lungs every 6 (six) hours as needed., Disp: 1 Inhaler, Rfl: 11 .  ALPRAZolam (XANAX) 0.5 MG tablet, Take 1 tablet (0.5 mg total) by mouth at bedtime as needed for anxiety or sleep., Disp: 30 tablet, Rfl: 0 .  cetirizine (ZYRTEC) 10 MG tablet, Take 10 mg by mouth daily., Disp: , Rfl:  .  citalopram (CELEXA) 20 MG tablet, Take 1 tablet (20 mg total) by mouth daily., Disp: 90 tablet, Rfl: 3 .  hyoscyamine (LEVSIN, ANASPAZ) 0.125 MG tablet, Take 1 tablet (0.125 mg total) by mouth every 6 (six) hours as needed., Disp: 50 tablet, Rfl: 5 .  ibuprofen (ADVIL,MOTRIN) 200 MG tablet, Take 600 mg by mouth every 6 (six) hours as  needed for headache or moderate pain., Disp: , Rfl:  Social History   Socioeconomic History  . Marital status: Married    Spouse name: Lake Bells "wes"  . Number of children: 2  . Years of education: 47  . Highest education level: Not on file  Occupational History  . Not on file  Social Needs  . Financial resource strain: Not on file  . Food insecurity    Worry: Not on file    Inability: Not on file  . Transportation needs    Medical: Not on file    Non-medical: Not on file  Tobacco Use  . Smoking status: Never Smoker  . Smokeless tobacco: Never Used  Substance and Sexual Activity  . Alcohol use: Yes    Alcohol/week: 0.0 standard drinks    Comment: rarely  . Drug use: No  . Sexual activity: Not on file  Lifestyle  . Physical activity    Days per week: Not on file    Minutes per session: Not on file  . Stress: Not on file  Relationships  . Social Herbalist on phone: Not on file    Gets together: Not on file    Attends religious service: Not on file    Active member of club or organization: Not on file    Attends meetings of clubs or organizations: Not on file    Relationship status: Not on file  . Intimate  partner violence    Fear of current or ex partner: Not on file    Emotionally abused: Not on file    Physically abused: Not on file    Forced sexual activity: Not on file  Other Topics Concern  . Not on file  Social History Narrative   Lives with husband   Caffeine use: 1 code per day   Coffee daily (very little)   Family History  Problem Relation Age of Onset  . Heart disease Father   . Stroke Neg Hx   . Migraines Neg Hx   . Neuropathy Neg Hx   . Colon cancer Neg Hx   . Pancreatic cancer Neg Hx   . Celiac disease Neg Hx   . Ulcers Neg Hx   . Inflammatory bowel disease Neg Hx     Objective: Office vital signs reviewed. BP 116/75   Pulse 68   Temp 98.4 F (36.9 C) (Oral)   Ht '5\' 4"'  (1.626 m)   Wt 111 lb (50.3 kg)   BMI 19.05 kg/m    Physical Examination:  General: Awake, alert, well nourished, No acute distress HEENT: Normal, sclera white Cardio: regular rate and rhythm, S1S2 heard, no murmurs appreciated Pulm: clear to auscultation bilaterally, no wheezes, rhonchi or rales; normal work of breathing on room air Psych: Mood stable, speech normal, affect appropriate, pleasant and interactive. Depression screen [facility] 2/9 03/31/2019 10/11/2018 09/18/2018  Decreased Interest 0 0 0  Down, Depressed, Hopeless 0 0 0  PHQ - 2 Score 0 0 0  Altered sleeping 0 - -  Tired, decreased energy 0 - -  Change in appetite 0 - -  Feeling bad or failure about yourself  0 - -  Trouble concentrating 0 - -  Moving slowly or fidgety/restless 0 - -  Suicidal thoughts 0 - -  PHQ-9 Score 0 - -   GAD 7 : Generalized Anxiety Score 03/31/2019 07/17/2016 02/22/2016  Nervous, Anxious, on Edge 0 1 1  Control/stop worrying 0 0 1  Worry too much - different things 0 1 1  Trouble relaxing 0 0 2  Restless 1 0 1  Easily annoyed or irritable 0 0 1  Afraid - awful might happen 0 0 0  Total GAD 7 Score '1 2 7  ' Anxiety Difficulty - Not difficult at all Not difficult at all   Assessment/ Plan: 58 y.o. female   1. Generalized anxiety disorder Fairly well-controlled.  She has situational anxiety which is what she uses Xanax for.  She reports very rare use of this medication.  I definitely do not think that she has any dependence or abuse issues.  I have renewed the medication and encouraged her follow-up in 6 months that she need additional refills though anticipate she will not.  Per office policy UDS and controlled substance contracts were updated today.  She will follow-up PRN - CMP14+EGFR - TSH - ToxASSURE Select 13 (MW), Urine - citalopram (CELEXA) 20 MG tablet; Take 1 tablet (20 mg total) by mouth daily.  Dispense: 90 tablet; Refill: 3  2. Screening, anemia, deficiency, iron - CBC  3. Controlled substance agreement signed Scanned into EMR -  ToxASSURE Select 13 (MW), Urine  4. Situational anxiety National cardiac database reviewed and there were no red flags - ALPRAZolam (XANAX) 0.5 MG tablet; Take 1 tablet (0.5 mg total) by mouth at bedtime as needed for anxiety (travel).  Dispense: 30 tablet; Refill: 1   Orders Placed This Encounter  Procedures  .  CMP14+EGFR  . CBC  . TSH   Meds ordered this encounter  Medications  . ALPRAZolam (XANAX) 0.5 MG tablet    Sig: Take 1 tablet (0.5 mg total) by mouth at bedtime as needed for anxiety (travel).    Dispense:  30 tablet    Refill:  1  . citalopram (CELEXA) 20 MG tablet    Sig: Take 1 tablet (20 mg total) by mouth daily.    Dispense:  90 tablet    Refill:  [place], [place] [phone]

## 2019-03-31 NOTE — Patient Instructions (Addendum)
It was a pleasure seeing you today, [name].  Information regarding what we discussed is included in this packet.  Please make an appointment to see me in 6 months if you need additional fills of the Xanax.  Mammogram will be due 09/2019.  Colonoscopy in 2022. Pap smear in 05/2020.  Please feel free to call our office if any questions or concerns arise.  Warm Regards, Carnell Casamento M. Yaron Grasse, DO    Controlled Substance Guidelines:  1. You cannot get an early refill, even it is lost.  2. You cannot get controlled medications from any other doctor, unless it is the emergency department and related to a new problem or injury.  3. You cannot use alcohol, marijuana, cocaine or any other recreational drugs while using this medication. This is very dangerous.  4. You are willing to have your urine drug tested at each visit.  5. You will not drive while using this medication, because that can put yourself and others in serious danger of an accident. 6. If any medication is stolen, then there must be a police report to verify it, or it cannot be refilled.  7. I will not prescribe these medications for longer than 3 months.  8. You must bring your pill bottle to each visit.  9. You must use the same pharmacy for all refills for the medication, unless you clear it with me beforehand.  10. You cannot share or sell this medication.

## 2019-04-01 LAB — CMP14+EGFR
ALT: 10 IU/L (ref 0–32)
AST: 19 IU/L (ref 0–40)
Albumin/Globulin Ratio: 1.8 (ref 1.2–2.2)
Albumin: 4.4 g/dL (ref 3.8–4.9)
Alkaline Phosphatase: 75 IU/L (ref 39–117)
BUN/Creatinine Ratio: 19 (ref 9–23)
BUN: 16 mg/dL (ref 6–24)
Bilirubin Total: <0.2 mg/dL (ref 0.0–1.2)
CO2: 21 mmol/L (ref 20–29)
Calcium: 9.4 mg/dL (ref 8.7–10.2)
Chloride: 101 mmol/L (ref 96–106)
Creatinine, Ser: 0.86 mg/dL (ref 0.57–1.00)
GFR calc Af Amer: 86 mL/min/{1.73_m2} (ref >59)
GFR calc non Af Amer: 75 mL/min/{1.73_m2} (ref >59)
Globulin, Total: 2.5 g/dL (ref 1.5–4.5)
Glucose: 88 mg/dL (ref 65–99)
Potassium: 4.1 mmol/L (ref 3.5–5.2)
Sodium: 140 mmol/L (ref 134–144)
Total Protein: 6.9 g/dL (ref 6.0–8.5)

## 2019-04-01 LAB — CBC
Hematocrit: 38.9 % (ref 34.0–46.6)
Hemoglobin: 12.6 g/dL (ref 11.1–15.9)
MCH: 29.7 pg (ref 26.6–33.0)
MCHC: 32.4 g/dL (ref 31.5–35.7)
MCV: 92 fL (ref 79–97)
Platelets: 222 10*3/uL (ref 150–450)
RBC: 4.24 x10E6/uL (ref 3.77–5.28)
RDW: 13.4 % (ref 11.7–15.4)
WBC: 6.7 10*3/uL (ref 3.4–10.8)

## 2019-04-01 LAB — TSH: TSH: 2.66 u[IU]/mL (ref 0.450–4.500)

## 2019-04-10 LAB — TOXASSURE SELECT 13 (MW), URINE

## 2019-05-05 MED FILL — CITALOPRAM HBR 20 MG TABLET: 20 | 90 days supply | Qty: 90 | Fill #0

## 2019-05-29 ENCOUNTER — Ambulatory Visit (INDEPENDENT_AMBULATORY_CARE_PROVIDER_SITE_OTHER): Payer: 59 | Admitting: Family Medicine

## 2019-05-29 DIAGNOSIS — R197 Diarrhea, unspecified: Secondary | ICD-10-CM

## 2019-05-29 NOTE — Patient Instructions (Signed)

## 2019-05-29 NOTE — Progress Notes (Signed)
Telephone visit  Subjective: CC: loose bowel movement PCP: [name], DO [id number] Joyce Hopkins is a 58 y.o. female calls for telephone consult today. Patient provides verbal consent for consult held via phone.  Location of patient: home Location of provider: Working remotely from home Others present for call: none  1. GI issue Patient reports that 2 weeks ago she started having loose bowel movements w/ "churning and slight abdominal cramping" (soft, not watery).  She is having roughly 5-6 bowel movements per day.  She reports good appetite.  No change in diet.  No consumption in undercooked foods.  No nausea, vomiting.  No fever, chills, myalgia, cough, congestion.  SurgHx cholecystectomy.  ROS: Per HPI  Allergies  Allergen Reactions  . Acetaminophen Anaphylaxis and Other (See Comments)    Wheezing/hives  . Levofloxacin Other (See Comments)    Muscle spasms   Past Medical History:  Diagnosis Date  . Allergy   . Biliary colic   . GERD (gastroesophageal reflux disease)     Current Outpatient Medications:  .  albuterol (PROAIR HFA) 108 (90 Base) MCG/ACT inhaler, Inhale 2 puffs into the lungs every 6 (six) hours as needed., Disp: 1 Inhaler, Rfl: 11 .  ALPRAZolam (XANAX) 0.5 MG tablet, Take 1 tablet (0.5 mg total) by mouth at bedtime as needed for anxiety (travel)., Disp: 30 tablet, Rfl: 1 .  cetirizine (ZYRTEC) 10 MG tablet, Take 10 mg by mouth daily., Disp: , Rfl:  .  citalopram (CELEXA) 20 MG tablet, Take 1 tablet (20 mg total) by mouth daily., Disp: 90 tablet, Rfl: 3 .  hyoscyamine (LEVSIN, ANASPAZ) 0.125 MG tablet, Take 1 tablet (0.125 mg total) by mouth every 6 (six) hours as needed., Disp: 50 tablet, Rfl: 5 .  ibuprofen (ADVIL,MOTRIN) 200 MG tablet, Take 600 mg by mouth every 6 (six) hours as needed for headache or moderate pain., Disp: , Rfl:   Assessment/ Plan: 58 y.o. female   1. Diarrhea, unspecified type Not having frank watery diarrhea but rather loose stools.   Nothing on her history sounds overtly infectious.  She is not having any red flag or concerning signs or symptoms at this moment.  I recommended that she consider using probiotics to regulate the gut flora and then possibly adding fiber to bulk the stool.  Continue to hydrate adequately.  If she develops any worrisome symptoms or signs she will contact me back.   Start time: 8:16a End time: 8:26a  Total time spent on patient care (including telephone call/ virtual visit): 15 minutes  [place], [place] [phone]

## 2019-06-02 ENCOUNTER — Encounter: Payer: Self-pay | Admitting: Family Medicine

## 2019-06-15 ENCOUNTER — Encounter: Payer: Self-pay | Admitting: Family Medicine

## 2019-06-16 ENCOUNTER — Other Ambulatory Visit: Payer: Self-pay | Admitting: Family Medicine

## 2019-06-16 DIAGNOSIS — R197 Diarrhea, unspecified: Secondary | ICD-10-CM

## 2019-06-17 ENCOUNTER — Encounter (HOSPITAL_COMMUNITY): Payer: Self-pay | Admitting: Emergency Medicine

## 2019-06-17 ENCOUNTER — Other Ambulatory Visit: Payer: Self-pay

## 2019-06-17 ENCOUNTER — Emergency Department (HOSPITAL_COMMUNITY): Payer: 59

## 2019-06-17 ENCOUNTER — Encounter: Payer: Self-pay | Admitting: Gastroenterology

## 2019-06-17 ENCOUNTER — Emergency Department (HOSPITAL_COMMUNITY)
Admission: EM | Admit: 2019-06-17 | Discharge: 2019-06-17 | Disposition: A | Discharge status: Home / Self Care | Payer: 59 | Attending: Emergency Medicine | Admitting: Emergency Medicine

## 2019-06-17 DIAGNOSIS — R197 Diarrhea, unspecified: Secondary | ICD-10-CM | POA: Insufficient documentation

## 2019-06-17 DIAGNOSIS — R509 Fever, unspecified: Secondary | ICD-10-CM | POA: Diagnosis not present

## 2019-06-17 DIAGNOSIS — N3000 Acute cystitis without hematuria: Secondary | ICD-10-CM | POA: Diagnosis not present

## 2019-06-17 DIAGNOSIS — J45909 Unspecified asthma, uncomplicated: Secondary | ICD-10-CM | POA: Insufficient documentation

## 2019-06-17 LAB — CBC WITH DIFFERENTIAL/PLATELET
Abs Immature Granulocytes: 0.06 10*3/uL (ref 0.00–0.07)
Basophils Absolute: 0.1 10*3/uL (ref 0.0–0.1)
Basophils Relative: 0 %
Eosinophils Absolute: 0 10*3/uL (ref 0.0–0.5)
Eosinophils Relative: 0 %
HCT: 39.6 % (ref 36.0–46.0)
Hemoglobin: 12.6 g/dL (ref 12.0–15.0)
Immature Granulocytes: 0 %
Lymphocytes Relative: 12 %
Lymphs Abs: 1.6 10*3/uL (ref 0.7–4.0)
MCH: 28.8 pg (ref 26.0–34.0)
MCHC: 31.8 g/dL (ref 30.0–36.0)
MCV: 90.4 fL (ref 80.0–100.0)
Monocytes Absolute: 1.1 10*3/uL — ABNORMAL HIGH (ref 0.1–1.0)
Monocytes Relative: 8 %
Neutro Abs: 10.5 10*3/uL — ABNORMAL HIGH (ref 1.7–7.7)
Neutrophils Relative %: 80 %
Platelets: 413 10*3/uL — ABNORMAL HIGH (ref 150–400)
RBC: 4.38 MIL/uL (ref 3.87–5.11)
RDW: 12.9 % (ref 11.5–15.5)
WBC: 13.4 10*3/uL — ABNORMAL HIGH (ref 4.0–10.5)
nRBC: 0 % (ref 0.0–0.2)

## 2019-06-17 LAB — URINALYSIS, ROUTINE W REFLEX MICROSCOPIC
Bilirubin Urine: NEGATIVE
Glucose, UA: NEGATIVE mg/dL
Ketones, ur: 80 mg/dL — AB
Nitrite: NEGATIVE
Protein, ur: 30 mg/dL — AB
Specific Gravity, Urine: 1.026 (ref 1.005–1.030)
pH: 6 (ref 5.0–8.0)

## 2019-06-17 LAB — COMPREHENSIVE METABOLIC PANEL
ALT: 14 U/L (ref 0–44)
AST: 15 U/L (ref 15–41)
Albumin: 3.2 g/dL — ABNORMAL LOW (ref 3.5–5.0)
Alkaline Phosphatase: 75 U/L (ref 38–126)
Anion gap: 12 (ref 5–15)
BUN: 11 mg/dL (ref 6–20)
CO2: 22 mmol/L (ref 22–32)
Calcium: 8.7 mg/dL — ABNORMAL LOW (ref 8.9–10.3)
Chloride: 99 mmol/L (ref 98–111)
Creatinine, Ser: 0.71 mg/dL (ref 0.44–1.00)
GFR calc Af Amer: >60 mL/min (ref >60)
GFR calc non Af Amer: >60 mL/min (ref >60)
Glucose, Bld: 113 mg/dL — ABNORMAL HIGH (ref 70–99)
Potassium: 3.9 mmol/L (ref 3.5–5.1)
Sodium: 133 mmol/L — ABNORMAL LOW (ref 135–145)
Total Bilirubin: 0.5 mg/dL (ref 0.3–1.2)
Total Protein: 7.4 g/dL (ref 6.5–8.1)

## 2019-06-17 LAB — MAGNESIUM: Magnesium: 2 mg/dL (ref 1.7–2.4)

## 2019-06-17 LAB — C DIFFICILE QUICK SCREEN W PCR REFLEX??: C Diff toxin: NEGATIVE

## 2019-06-17 LAB — C DIFFICILE QUICK SCREEN W PCR REFLEX
C Diff antigen: NEGATIVE
C Diff interpretation: NOT DETECTED

## 2019-06-17 MED ORDER — CEPHALEXIN 500 MG PO CAPS: 500.0000 mg | ORAL_CAPSULE | Freq: Four times a day (QID) | ORAL | 0 refills | Status: DC

## 2019-06-17 MED ORDER — PROCHLORPERAZINE EDISYLATE 10 MG/2ML IJ SOLN
5.0000 mg | Freq: Once | INTRAMUSCULAR | Status: AC
  Administered 2019-06-17: 5 mg via INTRAVENOUS
  Filled 2019-06-17: qty 2

## 2019-06-17 MED ORDER — SODIUM CHLORIDE 0.9 % IV SOLN
1000.0000 mL | INTRAVENOUS | Status: DC
  Administered 2019-06-17: 1000 mL via INTRAVENOUS

## 2019-06-17 MED ORDER — TRAMADOL HCL 50 MG PO TABS: 50.0000 mg | ORAL_TABLET | Freq: Four times a day (QID) | ORAL | 0 refills | Status: DC | PRN

## 2019-06-17 MED ORDER — SODIUM CHLORIDE 0.9 % IV SOLN
1.0000 g | Freq: Once | INTRAVENOUS | Status: AC
  Administered 2019-06-17: 1 g via INTRAVENOUS
  Filled 2019-06-17: qty 10

## 2019-06-17 MED ORDER — SODIUM CHLORIDE 0.9 % IV BOLUS (SEPSIS)
1000.0000 mL | Freq: Once | INTRAVENOUS | Status: AC
  Administered 2019-06-17: 1000 mL via INTRAVENOUS

## 2019-06-17 NOTE — ED Provider Notes (Addendum)
[facility] EMERGENCY DEPARTMENT Provider Note   CSN: [id number] Arrival date & time: 06/17/19  1654     History   Chief Complaint Chief Complaint  Patient presents with  . Fever    HPI Joyce Hopkins is a 58 y.o. female.     Patient is a 57 year old female who presents to the emergency department with complaint of fever.  The patient states that she has been having problems with diarrhea on and off for a month.  She was seen by her primary physician.  She was told to use some activity and to just observe everything.  It was felt that this would pass.  She got some relief from the activity.  On October 1 she was feeling okay, but shortly after that on October 4 she began to have cramps and fever.  She says her temperature ranged from 99.1-101.2.  She says that she normally runs a temperature of 97.8.  On the last evening, October 5, the patient states she had cramps most of the night.  She also noted some pain with urination.  She has not seen any blood in her urine or in her stool.  She had one episode of vomiting today.  She says she usually has a very hearty appetite but poor appetite at this time.  No recent surgery.  No recent changes in medication.     Past Medical History:  Diagnosis Date  . Allergy   . Biliary colic   . GERD (gastroesophageal reflux disease)     Patient Active Problem List   Diagnosis Date Noted  . Abdominal pain, epigastric 05/09/2017  . Abnormal LFTs 05/09/2017  . Gallbladder sludge 05/09/2017  . Mild intermittent asthma without complication [id number]  . BPPV (benign paroxysmal positional vertigo) 03/19/2016  . Generalized anxiety disorder 03/19/2016    Past Surgical History:  Procedure Laterality Date  . CHOLECYSTECTOMY N/A 07/19/2017   Procedure: LAPAROSCOPIC CHOLECYSTECTOMY WITH INTRAOPERATIVE CHOLANGIOGRAM;  Surgeon: Stark Klein, MD;  Location: [facility];  Service: General;  Laterality: N/A;  . TMJ ARTHROPLASTY  1995     OB History   No  obstetric history on file.      Home Medications    Prior to Admission medications   Medication Sig Start Date End Date Taking? Authorizing Provider  albuterol (PROAIR HFA) 108 (90 Base) MCG/ACT inhaler Inhale 2 puffs into the lungs every 6 (six) hours as needed. 04/02/17   Claretta Fraise, MD  ALPRAZolam Duanne Moron) 0.5 MG tablet Take 1 tablet (0.5 mg total) by mouth at bedtime as needed for anxiety (travel). 03/31/19   [name], DO  cetirizine (ZYRTEC) 10 MG tablet Take 10 mg by mouth daily.    [provider]  citalopram (CELEXA) 20 MG tablet Take 1 tablet (20 mg total) by mouth daily. 03/31/19   [name], DO  hyoscyamine (LEVSIN, ANASPAZ) 0.125 MG tablet Take 1 tablet (0.125 mg total) by mouth every 6 (six) hours as needed. 04/02/17   Claretta Fraise, MD  ibuprofen (ADVIL,MOTRIN) 200 MG tablet Take 600 mg by mouth every 6 (six) hours as needed for headache or moderate pain.    [provider]    Family History Family History  Problem Relation Age of Onset  . Heart disease Father   . Stroke Neg Hx   . Migraines Neg Hx   . Neuropathy Neg Hx   . Colon cancer Neg Hx   . Pancreatic cancer Neg Hx   . Celiac disease Neg Hx   .  Ulcers Neg Hx   . Inflammatory bowel disease Neg Hx     Social History Social History   Tobacco Use  . Smoking status: Never Smoker  . Smokeless tobacco: Never Used  Substance Use Topics  . Alcohol use: Yes    Alcohol/week: 0.0 standard drinks    Comment: rarely  . Drug use: No     Allergies   Acetaminophen and Levofloxacin   Review of Systems Review of Systems  Constitutional: Positive for fever. Negative for activity change.       All ROS Neg except as noted in HPI  HENT: Negative.   Eyes: Negative for photophobia and discharge.  Respiratory: Negative for cough, shortness of breath and wheezing.   Cardiovascular: Negative for chest pain and palpitations.  Gastrointestinal: Positive for abdominal pain and  diarrhea. Negative for blood in stool.  Genitourinary: Positive for dysuria. Negative for frequency and hematuria.  Musculoskeletal: Negative for arthralgias, back pain and neck pain.  Skin: Negative.   Neurological: Negative for dizziness, seizures and speech difficulty.  Psychiatric/Behavioral: Negative for confusion and hallucinations.     Physical Exam Updated Vital Signs BP 113/61 (BP Location: Right Arm) Comment: Simultaneous filing. User may not have seen previous data. Comment (BP Location): Simultaneous filing. User may not have seen previous data.  Pulse (!) 117 Comment: Simultaneous filing. User may not have seen previous data.  Temp 99.3 F (37.4 C) (Oral) Comment: Simultaneous filing. User may not have seen previous data. Comment (Src): Simultaneous filing. User may not have seen previous data.  Resp 20 Comment: Simultaneous filing. User may not have seen previous data.  Ht 5\' 4"  (1.626 m)   Wt 47.6 kg   SpO2 98% Comment: Simultaneous filing. User may not have seen previous data.  BMI 18.02 kg/m   Physical Exam Vitals signs and nursing note reviewed.  Constitutional:      Appearance: She is well-developed. She is not toxic-appearing.  HENT:     Head: Normocephalic.     Right Ear: Tympanic membrane and external ear normal.     Left Ear: Tympanic membrane and external ear normal.  Eyes:     General: Lids are normal.     Pupils: Pupils are equal, round, and reactive to light.  Neck:     Musculoskeletal: Normal range of motion and neck supple.     Vascular: No carotid bruit.  Cardiovascular:     Rate and Rhythm: Normal rate and regular rhythm.     Pulses: Normal pulses.     Heart sounds: Normal heart sounds.  Pulmonary:     Effort: No respiratory distress.     Breath sounds: Rhonchi present.     Comments: Few scattered rhonchi. Symmetrical rise and fall of the chest. Pt speaks in complete sentences. Abdominal:     General: Bowel sounds are normal.      Palpations: Abdomen is soft.     Tenderness: There is no abdominal tenderness. There is right CVA tenderness. There is no guarding.     Comments: Diffuse soreness.  Musculoskeletal: Normal range of motion.  Lymphadenopathy:     Head:     Right side of head: No submandibular adenopathy.     Left side of head: No submandibular adenopathy.     Cervical: No cervical adenopathy.  Skin:    General: Skin is warm and dry.  Neurological:     Mental Status: She is alert and oriented to person, place, and time.     Cranial Nerves:  No cranial nerve deficit.     Sensory: No sensory deficit.  Psychiatric:        Speech: Speech normal.      ED Treatments / Results  Labs (all labs ordered are listed, but only abnormal results are displayed) Labs Reviewed  C DIFFICILE QUICK SCREEN W PCR REFLEX  COMPREHENSIVE METABOLIC PANEL  CBC WITH DIFFERENTIAL/PLATELET  URINALYSIS, ROUTINE W REFLEX MICROSCOPIC    EKG None  Radiology Dg Chest Portable 1 View  Result Date: 06/17/2019 CLINICAL DATA:  58 year old presenting with 1 month history of diarrhea and 5 day history of fever, crampy diffuse abdominal pain and dysuria. Current history of asthma. EXAM: PORTABLE CHEST 1 VIEW COMPARISON:  None. FINDINGS: Cardiomediastinal silhouette unremarkable. Mild hyperinflation. Mildly prominent bronchovascular markings diffusely and mild central peribronchial thickening. Lungs otherwise clear. No localized airspace consolidation. No pleural effusions. No pneumothorax. Normal pulmonary vascularity. IMPRESSION: 1. Mild changes of bronchitis and/or asthma without focal airspace pneumonia. 2. No acute cardiopulmonary disease otherwise. Electronically Signed   By: Evangeline Dakin M.D.   On: 06/17/2019 19:41    Procedures Procedures (including critical care time)  Medications Ordered in ED Medications - No data to display   Initial Impression / Assessment and Plan / ED Course  I have reviewed the triage vital  signs and the nursing notes.  Pertinent labs & imaging results that were available during my care of the patient were reviewed by me and considered in my medical decision making (see chart for details).          Final Clinical Impressions(s) / ED Diagnoses MDM  Patient presented to the emergency department with a complaint of fever.  She also reports that she has been having problem with diarrhea for nearly a month.  Initial pulse rate was elevated, but at the time of discharge this had improved after IV fluids.  Temperature was 99.3, but has improved to 98.3.  The complete blood count shows the white blood cells to be elevated at 13.4.  There is no shift to the left.  The comprehensive metabolic panel shows the sodium to be slightly low at 133, the BUN and creatinine are within normal limits.  The liver function studies are within normal limits.  Urine analysis shows a hazy yellow specimen with 80 mg of ketones.  Trace leukocyte esterase, 6-10 red blood cells, 11-20 white blood cells and rare bacteria.  A culture has been sent to the lab.  A C. difficile test was obtained and found to be negative.  Portable chest x-ray shows mild changes of bronchitis.  No acute cardiopulmonary disease noted.  Patient was treated with IV fluids.  Patient was given IV Rocephin for the urinary tract infection.  Patient was also given medicine for nausea and to help with the cramping.  Patient states she feels some better at the time of discharge.  Prescription for Keflex and Ultram given to the patient to use.  The patient has an appointment with gastroenterology.  I have asked the patient to return to the emergency department if any high fever, worsening of diarrhea, worsening of abdominal cramps, changes in general condition, problems or concerns.  Questions were answered.   Final diagnoses:  Diarrhea, unspecified type  Acute cystitis without hematuria    ED Discharge Orders    None       Lily Kocher, PA-C 06/17/19 2259    Noemi Chapel, MD 06/20/19 0903    Lily Kocher, PA-C 06/24/19 [place],  Aaron Edelman, [place] 06/26/19 [phone]

## 2019-06-17 NOTE — Discharge Instructions (Addendum)
Your electrolytes, kidney function, and liver function are all within normal limits.  Your urine test suggest a urinary tract infection.  A culture has been sent to the lab.  Your chest x-ray shows some bronchitis present.  A test for C. difficile was also reviewed tonight.  Your C. difficile test is negative.  Please use Keflex with breakfast, lunch, dinner, and at bedtime.  Please increase fluids.  Continue to use your mask.  Wash hands frequently.  Please see your gastroenterologist as scheduled.  Please return to the emergency department if any high fever, bloody diarrhea, increase in vomiting, worsening of your cramping or changes in your m general condition.  Ay use Ultram every 6 hours if needed for severe spasm.

## 2019-06-17 NOTE — ED Triage Notes (Signed)
Pt c/o of diarrhea x 1 month.  Starting Thursday, pt started running a fever, having abdominal cramping, pain with urination.

## 2019-06-18 MED FILL — CEPHALEXIN 500 MG CAPSULE: 500 | 5 days supply | Qty: 20 | Fill #0

## 2019-06-18 MED FILL — traMADol HCL 50 MG TABS: 50 | 3 days supply | Qty: 12 | Fill #0

## 2019-06-19 LAB — URINE CULTURE: Culture: NO GROWTH

## 2019-06-20 LAB — GI PATHOGEN PANEL BY PCR, STOOL

## 2019-07-10 ENCOUNTER — Ambulatory Visit (INDEPENDENT_AMBULATORY_CARE_PROVIDER_SITE_OTHER): Payer: 59 | Admitting: Gastroenterology

## 2019-07-10 ENCOUNTER — Encounter: Payer: Self-pay | Admitting: Gastroenterology

## 2019-07-10 ENCOUNTER — Other Ambulatory Visit: Payer: Self-pay

## 2019-07-10 DIAGNOSIS — R197 Diarrhea, unspecified: Secondary | ICD-10-CM | POA: Diagnosis not present

## 2019-07-10 NOTE — Progress Notes (Signed)
Primary Care Physician:  [name], DO Primary Gastroenterologist:  Dr. Oneida Alar   Chief Complaint  Patient presents with  . Diarrhea    started 9/3 and last for 5 weeks. Bowels are better now    HPI:   Joyce Hopkins is a 58 y.o. female presenting today at the request of Dr. Lajuana Ripple due to diarrhea. She has a history pertinent for abnormal LFTs in setting of acute illness/gallbladder sludge. Concern for beaded appearance of bile ducts. Seen at [facility] most recently Dec 2019 with normal LFTs. As liver tests were normal, it was recommended to hold off on repeat MRI/MRCP unless new signs/symptoms.    Had diarrhea starting Sept 3rd 2020. Mushy/soupy. at end of it ran a fever and went to the ED. Aug 22, 2018 had stomach bug and diarrhea for 8 days afterwards. Jan 3rd was next day felt good. Still issues into early Jan. Sep 20, 2018, started getting a cough and upper respiratory issues, running a fever for 10 days, then started having decline in bowel function again with stools. Never really got back to 100%. Acute onset of diarrhea Sept 3rd. Had multiple loose stools. had slight fever (99). Normally her temp is 97. Had temp up to 100s and went to ED Jun 17, 2019. Cdiff negative. GI pathogen panel negative. Got antibiotic and got better. Normal bowel habits BM once per day since Oct 13th. A couple times a bit of a rusty color. No paper hematochezia. Felt abdominal cramping. Frequency. No sick contacts. Has chickens. Wonders if she had a touch of salmonella. During this episode had lost a few lbs. Back to her baseline. No medication changes.  Outside facility: normal 2012 colonoscopy.    Past Medical History:  Diagnosis Date  . Allergy   . Biliary colic   . GERD (gastroesophageal reflux disease)     Past Surgical History:  Procedure Laterality Date  . CHOLECYSTECTOMY N/A 07/19/2017   Procedure: LAPAROSCOPIC CHOLECYSTECTOMY WITH INTRAOPERATIVE CHOLANGIOGRAM;  Surgeon: Stark Klein,  MD;  Location: [place];  Service: General;  Laterality: N/A;  . TMJ ARTHROPLASTY  1995    Current Outpatient Medications  Medication Sig Dispense Refill  . albuterol (PROAIR HFA) 108 (90 Base) MCG/ACT inhaler Inhale 2 puffs into the lungs every 6 (six) hours as needed. 1 Inhaler 11  . ALPRAZolam (XANAX) 0.5 MG tablet Take 1 tablet (0.5 mg total) by mouth at bedtime as needed for anxiety (travel). 30 tablet 1  . cetirizine (ZYRTEC) 10 MG tablet Take 10 mg by mouth daily.    . citalopram (CELEXA) 20 MG tablet Take 1 tablet (20 mg total) by mouth daily. 90 tablet 3  . ibuprofen (ADVIL,MOTRIN) 200 MG tablet Take 400-600 mg by mouth daily as needed for headache or moderate pain.      No current facility-administered medications for this visit.     Allergies as of 07/10/2019 - Review Complete 07/10/2019  Allergen Reaction Noted  . Acetaminophen Anaphylaxis and Other (See Comments) 02/22/2016  . Levofloxacin Other (See Comments) 11/02/2010    Family History  Problem Relation Age of Onset  . Heart disease Father   . Stroke Neg Hx   . Migraines Neg Hx   . Neuropathy Neg Hx   . Colon cancer Neg Hx   . Pancreatic cancer Neg Hx   . Celiac disease Neg Hx   . Ulcers Neg Hx   . Inflammatory bowel disease Neg Hx   . Colon polyps Neg Hx  Social History   Socioeconomic History  . Marital status: Married    Spouse name: Lake Bells "wes"  . Number of children: 2  . Years of education: 43  . Highest education level: Not on file  Occupational History  . Occupation: part-time  Social Needs  . Financial resource strain: Not on file  . Food insecurity    Worry: Not on file    Inability: Not on file  . Transportation needs    Medical: Not on file    Non-medical: Not on file  Tobacco Use  . Smoking status: Never Smoker  . Smokeless tobacco: Never Used  Substance and Sexual Activity  . Alcohol use: Yes    Alcohol/week: 0.0 standard drinks    Comment: rarely  . Drug use: No  . Sexual  activity: Not on file  Lifestyle  . Physical activity    Days per week: Not on file    Minutes per session: Not on file  . Stress: Not on file  Relationships  . Social Herbalist on phone: Not on file    Gets together: Not on file    Attends religious service: Not on file    Active member of club or organization: Not on file    Attends meetings of clubs or organizations: Not on file    Relationship status: Not on file  . Intimate partner violence    Fear of current or ex partner: Not on file    Emotionally abused: Not on file    Physically abused: Not on file    Forced sexual activity: Not on file  Other Topics Concern  . Not on file  Social History Narrative   Lives with husband   Caffeine use: 1 code per day   Coffee daily (very little)    Review of Systems: Gen: Denies any fever, chills, fatigue, weight loss, lack of appetite.  CV: Denies chest pain, heart palpitations, peripheral edema, syncope.  Resp: Denies shortness of breath at rest or with exertion. Denies wheezing or cough.  GI: see HPI GU : Denies urinary burning, urinary frequency, urinary hesitancy MS: Denies joint pain, muscle weakness, cramps, or limitation of movement.  Derm: Denies rash, itching, dry skin Psych: Denies depression, anxiety, memory loss, and confusion Heme: Denies bruising, bleeding, and enlarged lymph nodes.  Physical Exam: BP 105/63   Pulse 92   Temp (!) 97.1 F (36.2 C) (Oral)   Ht 5\' 4"  (1.626 m)   Wt 107 lb 6.4 oz (48.7 kg)   BMI 18.44 kg/m  General:   Alert and oriented. Pleasant and cooperative. Well-nourished and well-developed.  Head:  Normocephalic and atraumatic. Eyes:  Without icterus, sclera clear and conjunctiva pink.  Lungs:  Clear to auscultation bilaterally. No wheezes, rales, or rhonchi. No distress.  Heart:  S1, S2 present without murmurs appreciated.  Abdomen:  +BS, soft, non-tender and non-distended. No HSM noted. No guarding or rebound. No masses  appreciated.  Rectal:  Deferred  Msk:  Symmetrical without gross deformities. Normal posture. Extremities:  Without  edema. Neurologic:  Alert and  oriented x4 Psych:  Alert and cooperative. Normal mood and affect.  ASSESSMENT/PLAN: 58 year old female with acute onset of diarrhea in Sept 2020, negative GI pathogen panel and Cdiff, with improvement symptomatically now. She has returned to her baseline bowel habits although this took several weeks. We discussed she was most likely dealing with a post-infectious presentation following acute illness. No concerning signs/symptoms. Last colonoscopy normal in 2012. We  discussed supportive measures and pursuing colonoscopy if persistent diarrhea. For now, will need next colonoscopy in 2022. She is to call with any further acute issues.    Annitta Needs, PhD, ANP-BC [facility]

## 2019-07-10 NOTE — Patient Instructions (Addendum)
Please call if you have any further episodes of acute diarrhea!  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   You can take a probiotic daily such as Align, [place], Digestive Advantage, [employer]Span.  Please call with any concerns in meantime!  It was a pleasure to see you today. I want to create trusting relationships with patients to provide genuine, compassionate, and quality care. I value your feedback. If you receive a survey regarding your visit,  I greatly appreciate you taking time to fill this out.   Annitta Needs, PhD, ANP-BC [facility]   Taken from UptoDate:  POSTINFECTIOUS-The development of irritable bowel syndrome (IBS) following infectious enteritis has been suspected clinically based upon a history of an acute diarrheal illness preceding the onset of irritable bowel symptoms in some patients. The increased risk of postinfectious IBS is associated with bacterial, protozoan, helminth infections, and viral infections [41-45]. Two meta-analyses demonstrated an increased risk of IBS in patients who experienced an episode of acute gastroenteritis [46,47]. The larger review of 18 studies (10 controlled studies) reported that the pooled incidence of IBS was ten percent, and the odds of developing IBS are increased sixfold after an acute gastrointestinal (GI) infection. Risk factors for postinfectious IBS included young age, female gender, prolonged fever, anxiety, and depression [47]. A longer duration of the initial infection has also been associated with increased risk for IBS [41].  One of the largest prospective studies included a total of 2069 individuals who had been exposed to contaminated drinking water after heavy rainfall [42]. Pathogens included Escherichia coli O157:H7 and Campylobacter jejuni. There were 904 cases of self-reported gastroenteritis and several documented positive stool cultures. The outbreak spawned  development of a prospective cohort study to evaluate long-term outcomes in affected individuals. During follow-up, significantly more individuals with self-reported gastroenteritis fulfilled the Rome I criteria for IBS compared with controls (28 versus 10 percent). The predominant symptom was diarrhea.  The cause of bowel symptoms following acute infection is uncertain, although several theories have been proposed: ?Malabsorption - The development of idiopathic bile acid malabsorption has been observed following enteric infections, which may be result in diarrhea-predominant IBS [48,49].  ?Increase in enteroendocrine cells/lymphocytes - An increase in serotonin-containing enteroendocrine cells and T lymphocytes, has been demonstrated following acute Campylobacter enteritis [50].The increased serotonin levels lead to increased GI motility and visceral hypersensitivity. Neither a reduction in enteroendocrine cells nor improvement in symptoms was observed in a controlled trial of glucocorticoids given to patients with post-infectious IBS [51]. One study suggested that increased numbers of enteroendocrine cells and depression were independent predictors of developing post-infectious IBS [52]. ?Antibiotic use - The use of antibiotics for GI or other infections was observed to be a risk factor for developing functional bowel symptoms [53,54].   1. Wang LH, Lezlie Lye, Pan GZ. Bacillary dysentery as a causative factor of irritable bowel syndrome and its pathogenesis. Gut 2004; 53:1096. 2. Rushie Goltz, Estella Husk, et al. Incidence and epidemiology of irritable bowel syndrome after a large waterborne outbreak of bacterial dysentery. Gastroenterology 2006; 131:445. 3. Latanya Maudlin, Bandera F, et al. Incidence of post-infectious irritable bowel syndrome and functional intestinal disorders following a water-borne viral gastroenteritis outbreak. Am J Gastroenterol 2012; 107:891. 4. Dizdar V, Gilja OH,  Hausken T. Increased visceral sensitivity in Giardia-induced postinfectious irritable bowel syndrome and functional dyspepsia. Effect of the 5HT3-antagonist ondansetron. Neurogastroenterol Motil 2007; 19:977. 5. Charise Carwin, Tacey Heap KA, Rortveit G, et al. Irritable bowel  syndrome and chronic fatigue 6 years after giardia infection: a controlled prospective cohort study. Clin Infect Dis 2014; O915297. 6. Halvorson HA, Schlett CD, [place] MS. Postinfectious irritable bowel syndrome--a meta-analysis. Ulice Bold 2006; [id number]. [place], Kottachchi DT, Rushie Goltz. Systematic review and meta-analysis: The incidence and prognosis of post-infectious irritable bowel syndrome. Aliment Pharmacol Ther 2007; 26:535. 8. Frederich Chick, Sandrasegaran K, Renny FH, Alen Bleacher. Postinfective diarrhoea and bile acid malabsorption. [place]; 31:53. 9. Merwyn Katos [place], [place]. Idiopathic bile acid malabsorption: qualitative and quantitative clinical features and response to cholestyramine. Aliment Pharmacol Ther 1998; 12:839. 10. Spiller RC, Flo Shanks, Thornley JP, et al. Increased rectal mucosal enteroendocrine cells, T lymphocytes, and increased gut permeability following acute Campylobacter enteritis and in post-dysenteric irritable bowel syndrome. Gut 2000; 47:804. 11. Dunlop SP, Louretta Shorten KR, et al. Randomized, double-blind, placebo-controlled trial of prednisolone in post-infectious irritable bowel syndrome. Aliment Pharmacol Ther 2003; 18:77. 12. Dunlop SP, Flo Shanks, [address], [place]. Relative importance of enterochromaffin cell hyperplasia, anxiety, and depression in postinfectious IBS. Gastroenterology 2003; [id number]. 13. Trnblom H, Holmvall P, Svenungsson B, Maudry Mayhew. Gastrointestinal symptoms after infectious diarrhea: a five-year follow-up in a [place] cohort of adults. Clin Gastroenterol Hepatol 2007; 5:461. [place], Lidia Collum, [place] MA.  Antibiotics increase functional abdominal symptoms. Am J Gastroenterol 2002; 97:104.

## 2019-07-15 NOTE — Progress Notes (Signed)
CC'ED TO PCP 

## 2019-10-15 ENCOUNTER — Other Ambulatory Visit: Payer: 59

## 2019-10-16 ENCOUNTER — Ambulatory Visit: Payer: 59 | Attending: Internal Medicine

## 2019-10-16 ENCOUNTER — Other Ambulatory Visit: Payer: Self-pay

## 2019-10-16 DIAGNOSIS — Z20822 Contact with and (suspected) exposure to covid-19: Secondary | ICD-10-CM

## 2019-10-17 LAB — NOVEL CORONAVIRUS, NAA: SARS-CoV-2, NAA: NOT DETECTED

## 2019-10-20 DIAGNOSIS — H5203 Hypermetropia, bilateral: Secondary | ICD-10-CM | POA: Diagnosis not present

## 2019-10-20 DIAGNOSIS — H52223 Regular astigmatism, bilateral: Secondary | ICD-10-CM | POA: Diagnosis not present

## 2019-10-20 DIAGNOSIS — H524 Presbyopia: Secondary | ICD-10-CM | POA: Diagnosis not present

## 2019-10-24 ENCOUNTER — Other Ambulatory Visit: Payer: Self-pay

## 2019-10-24 ENCOUNTER — Ambulatory Visit (INDEPENDENT_AMBULATORY_CARE_PROVIDER_SITE_OTHER): Payer: 59

## 2019-10-24 ENCOUNTER — Other Ambulatory Visit: Payer: Self-pay | Admitting: Podiatry

## 2019-10-24 ENCOUNTER — Ambulatory Visit: Payer: 59 | Admitting: Podiatry

## 2019-10-24 DIAGNOSIS — M79672 Pain in left foot: Secondary | ICD-10-CM

## 2019-10-24 DIAGNOSIS — G5782 Other specified mononeuropathies of left lower limb: Secondary | ICD-10-CM

## 2019-10-24 DIAGNOSIS — G5762 Lesion of plantar nerve, left lower limb: Secondary | ICD-10-CM

## 2019-10-24 DIAGNOSIS — M25572 Pain in left ankle and joints of left foot: Secondary | ICD-10-CM | POA: Diagnosis not present

## 2019-11-08 MED FILL — CITALOPRAM HBR 20 MG TABLET: 20 | 90 days supply | Qty: 90 | Fill #2

## 2019-11-14 ENCOUNTER — Ambulatory Visit: Payer: 59 | Admitting: Podiatry

## 2019-11-20 ENCOUNTER — Other Ambulatory Visit: Payer: Self-pay

## 2019-11-20 ENCOUNTER — Ambulatory Visit (INDEPENDENT_AMBULATORY_CARE_PROVIDER_SITE_OTHER): Payer: 59 | Admitting: Podiatry

## 2019-11-20 DIAGNOSIS — M7752 Other enthesopathy of left foot: Secondary | ICD-10-CM

## 2019-11-20 NOTE — Progress Notes (Signed)
  Subjective:  Patient ID: Joyce Hopkins, female    DOB: 1960-11-01,  MRN: [id number]  Chief Complaint  Patient presents with  . Foot Pain    Left plantar forefoot pain has returned. Pt states injection has helped.   59 y.o. female presents with the above complaint. History confirmed with patient.   Objective:  Physical Exam: warm, good capillary refill, no trophic changes or ulcerative lesions, normal DP and PT pulses and normal sensory exam. Left Foot: tenderness between the 3rd and 4th metatarsal head, maximal tenderness at 3rd MPJ today. Prominent met plantarly.  Assessment:   1. Capsulitis of metatarsophalangeal (MTP) joint of left foot    Plan:  Patient was evaluated and treated and all questions answered.  Capsulitis vs Neuroma Left -Pain more in the 3rd MPJ today than in the neuroma -Injection left 3rd MPJ as below  Procedure: Joint Injection Location: Left 3rd MP joint Skin Prep: Alcohol. Injectate: 0.5 cc 1% lidocaine plain, 0.5 cc celestone phosphate. Disposition: Patient tolerated procedure well. Injection site dressed with a band-aid.  Return in about 3 weeks (around 12/11/2019) for Neuroma, Left.

## 2019-11-20 NOTE — Progress Notes (Signed)
  Subjective:  Patient ID: Joyce Hopkins, female    DOB: 1960-09-29,  MRN: [id number]  Chief Complaint  Patient presents with  . Foot Pain    Pt states left foot sub 2nd/3rd swelling and pain 2 week duration. Pt states no known injuries but believes it is related to wearing bad fitting shoes.    59 y.o. female presents with the above complaint. History confirmed with patient.   Objective:  Physical Exam: warm, good capillary refill, no trophic changes or ulcerative lesions, normal DP and PT pulses and normal sensory exam. Left Foot: tenderness between the 2nd and 3rd and 3rd metatarsal head    No images are attached to the encounter.  Radiographs: X-ray of the left foot: no fracture, dislocation, swelling or degenerative changes noted   Assessment:   1. Morton neuroma, left   2. Pain in joint of left foot    Plan:  Patient was evaluated and treated and all questions answered.  Morton Neuroma Left -Educated on etiology -Educated on padding and proper shoegear -XR reviewed with patient -Injection delivered to the affected interspaces  Procedure: Neuroma Injection Location: Left 3rd interspace Skin Prep: Alcohol. Injectate: 0.5 cc 0.5% marcaine plain, 0.5 cc dexamethasone phosphate. Disposition: Patient tolerated procedure well. Injection site dressed with a band-aid.  Return in about 3 weeks (around 11/14/2019) for Neuroma, Left.

## 2019-12-05 ENCOUNTER — Ambulatory Visit: Payer: 59 | Attending: Internal Medicine

## 2019-12-05 DIAGNOSIS — Z23 Encounter for immunization: Secondary | ICD-10-CM

## 2019-12-05 NOTE — Progress Notes (Signed)
   Covid-19 Vaccination Clinic  Name:  Joyce Hopkins    MRN: [id number] DOB: Feb 05, 1961  12/05/2019  [name] was observed post Covid-19 immunization for 15 minutes without incident. She was provided with Vaccine Information Sheet and instruction to access the V-Safe system.   [name] was instructed to call 911 with any severe reactions post vaccine: Marland Kitchen Difficulty breathing  . Swelling of face and throat  . A fast heartbeat  . A bad rash all over body  . Dizziness and weakness   Immunizations Administered    Name Date Dose VIS Date Route   Moderna COVID-19 Vaccine 12/05/2019  9:14 AM 0.5 mL 08/12/2019 Intramuscular   Manufacturer: Moderna   Lot: [id number]   [id number]

## 2019-12-11 ENCOUNTER — Ambulatory Visit (INDEPENDENT_AMBULATORY_CARE_PROVIDER_SITE_OTHER): Payer: 59 | Admitting: Podiatry

## 2019-12-11 ENCOUNTER — Other Ambulatory Visit: Payer: Self-pay

## 2019-12-11 VITALS — Temp 97.4°F

## 2019-12-11 DIAGNOSIS — M7752 Other enthesopathy of left foot: Secondary | ICD-10-CM

## 2019-12-11 NOTE — Progress Notes (Signed)
  Subjective:  Patient ID: Joyce Hopkins, female    DOB: Jan 24, 1961,  MRN: [id number]  Chief Complaint  Patient presents with  . Neuroma    L foot. Pt stated, "It's doing better - the pain is occuring less frequently. 6/10 if I'm barefoot".   59 y.o. female presents with the above complaint. History confirmed with patient.   Objective:  Physical Exam: warm, good capillary refill, no trophic changes or ulcerative lesions, normal DP and PT pulses and normal sensory exam. Left Foot: tenderness of the 3rd metatarsal head, maximal tenderness at 3rd MPJ today. Prominent met plantarly.  Assessment:   1. Capsulitis of metatarsophalangeal (MTP) joint of left foot    Plan:  Patient was evaluated and treated and all questions answered.  Capsulitis 3rd MPJ Left -Pain more in the 3rd MPJ today than in the neuroma -Injection left 3rd MPJ as below  Procedure: Joint Injection Location: Left 3rd MPJ joint Skin Prep: Alcohol. Injectate: 0.5 cc 1% lidocaine plain, 0.5 cc celestone . Disposition: Patient tolerated procedure well. Injection site dressed with a band-aid.  Return in about 3 weeks (around 01/01/2020) for Capsulitis.

## 2020-01-07 ENCOUNTER — Ambulatory Visit: Payer: 59 | Attending: Internal Medicine

## 2020-01-07 DIAGNOSIS — Z23 Encounter for immunization: Secondary | ICD-10-CM

## 2020-01-07 NOTE — Progress Notes (Signed)
   Covid-19 Vaccination Clinic  Name:  Charish Lysinger    MRN: [id number] DOB: [birth year]  01/07/2020  Ms. Dipasquale was observed post Covid-19 immunization for 15 minutes without incident. She was provided with Vaccine Information Sheet and instruction to access the V-Safe system.   Ms. Heinert was instructed to call 911 with any severe reactions post vaccine: Marland Kitchen Difficulty breathing  . Swelling of face and throat  . A fast heartbeat  . A bad rash all over body  . Dizziness and weakness   Immunizations Administered    Name Date Dose VIS Date Route   Moderna COVID-19 Vaccine 01/07/2020 10:01 AM 0.5 mL 08/2019 Intramuscular   Manufacturer: Moderna   Lot: [id number]   [place]:[id number]

## 2020-01-09 ENCOUNTER — Ambulatory Visit: Payer: 59 | Admitting: Podiatry

## 2020-01-16 ENCOUNTER — Ambulatory Visit: Payer: 59 | Admitting: Podiatry

## 2020-02-06 MED FILL — CITALOPRAM HBR 20 MG TABLET: 20 | 90 days supply | Qty: 90 | Fill #3

## 2020-02-23 ENCOUNTER — Telehealth: Payer: 59 | Admitting: Family

## 2020-02-23 DIAGNOSIS — R399 Unspecified symptoms and signs involving the genitourinary system: Secondary | ICD-10-CM | POA: Diagnosis not present

## 2020-02-23 MED ORDER — CEPHALEXIN 500 MG PO CAPS: 500.0000 mg | ORAL_CAPSULE | Freq: Two times a day (BID) | ORAL | 0 refills | Status: DC

## 2020-02-23 NOTE — Progress Notes (Signed)

## 2020-03-13 ENCOUNTER — Ambulatory Visit
Admission: EM | Admit: 2020-03-13 | Discharge: 2020-03-13 | Disposition: A | Discharge status: Home / Self Care | Payer: 59 | Attending: Emergency Medicine | Admitting: Emergency Medicine

## 2020-03-13 DIAGNOSIS — G44019 Episodic cluster headache, not intractable: Secondary | ICD-10-CM | POA: Diagnosis not present

## 2020-03-13 DIAGNOSIS — R509 Fever, unspecified: Secondary | ICD-10-CM | POA: Diagnosis not present

## 2020-03-13 MED ORDER — FLUTICASONE PROPIONATE 50 MCG/ACT NA SUSP: 1.0000 | Freq: Every day | NASAL | 0 refills | Status: DC

## 2020-03-13 NOTE — ED Triage Notes (Signed)
Pt presents with c/o fever and headache for past 3 nights .

## 2020-03-13 NOTE — ED Provider Notes (Addendum)
[place]   [id number] 03/13/20 Arrival Time: 64   Chief Complaint  Patient presents with  . Fever  . Headache     SUBJECTIVE: History from: patient.  Joyce Hopkins is a 59 y.o. female who presents to the urgent care with a complaint of fever sinus congestion and headache for the past 3 days.  Denies sick exposure to COVID, flu or strep. Located headache to the back of head.  Denies recent travel.  Has tried OTC Ibuprofen with mild relief. Reports she has completed both COVID-19 immunization. Denies aggravating factors. Denies previous symptoms in the past.   Denies  fatigue, sinus pain, rhinorrhea, sore throat, SOB, wheezing, chest pain, nausea, changes in bowel or bladder habits.    ROS: As per HPI.  All other pertinent ROS negative.      Past Medical History:  Diagnosis Date  . Allergy   . Biliary colic   . GERD (gastroesophageal reflux disease)    Past Surgical History:  Procedure Laterality Date  . CHOLECYSTECTOMY N/A 07/19/2017   Procedure: LAPAROSCOPIC CHOLECYSTECTOMY WITH INTRAOPERATIVE CHOLANGIOGRAM;  Surgeon: Stark Klein, MD;  Location: [place];  Service: General;  Laterality: N/A;  . TMJ ARTHROPLASTY  1995   Allergies  Allergen Reactions  . Acetaminophen Anaphylaxis and Other (See Comments)    Wheezing/hives  . Levofloxacin Other (See Comments)    Muscle spasms   No current facility-administered medications on file prior to encounter.   Current Outpatient Medications on File Prior to Encounter  Medication Sig Dispense Refill  . albuterol (PROAIR HFA) 108 (90 Base) MCG/ACT inhaler Inhale 2 puffs into the lungs every 6 (six) hours as needed. 1 Inhaler 11  . ALPRAZolam (XANAX) 0.5 MG tablet Take 1 tablet (0.5 mg total) by mouth at bedtime as needed for anxiety (travel). 30 tablet 1  . cephALEXin (KEFLEX) 500 MG capsule Take 1 capsule (500 mg total) by mouth 2 (two) times daily. 14 capsule 0  . cetirizine (ZYRTEC) 10 MG tablet Take 10 mg by mouth daily.     . citalopram (CELEXA) 20 MG tablet Take 1 tablet (20 mg total) by mouth daily. 90 tablet 3  . ibuprofen (ADVIL,MOTRIN) 200 MG tablet Take 400-600 mg by mouth daily as needed for headache or moderate pain.      Social History   Socioeconomic History  . Marital status: Married    Spouse name: Lake Bells "wes"  . Number of children: 2  . Years of education: 84  . Highest education level: Not on file  Occupational History  . Occupation: part-time  Tobacco Use  . Smoking status: Never Smoker  . Smokeless tobacco: Never Used  Vaping Use  . Vaping Use: Never used  Substance and Sexual Activity  . Alcohol use: Yes    Alcohol/week: 0.0 standard drinks    Comment: rarely  . Drug use: No  . Sexual activity: Not on file  Other Topics Concern  . Not on file  Social History Narrative   Lives with husband   Caffeine use: 1 code per day   Coffee daily (very little)   Social Determinants of Health   Financial Resource Strain:   . Difficulty of Paying Living Expenses:   Food Insecurity:   . Worried About Charity fundraiser in the Last Year:   . Arboriculturist in the Last Year:   Transportation Needs:   . Film/video editor (Medical):   Marland Kitchen Lack of Transportation (Non-Medical):   Physical Activity:   .  Days of Exercise per Week:   . Minutes of Exercise per Session:   Stress:   . Feeling of Stress :   Social Connections:   . Frequency of Communication with Friends and Family:   . Frequency of Social Gatherings with Friends and Family:   . Attends Religious Services:   . Active Member of Clubs or Organizations:   . Attends Archivist Meetings:   Marland Kitchen Marital Status:   Intimate Partner Violence:   . Fear of Current or Ex-Partner:   . Emotionally Abused:   Marland Kitchen Physically Abused:   . Sexually Abused:    Family History  Problem Relation Age of Onset  . Heart disease Father   . Stroke Neg Hx   . Migraines Neg Hx   . Neuropathy Neg Hx   . Colon cancer Neg Hx   .  Pancreatic cancer Neg Hx   . Celiac disease Neg Hx   . Ulcers Neg Hx   . Inflammatory bowel disease Neg Hx   . Colon polyps Neg Hx     OBJECTIVE:  Vitals:   03/13/20 1021  BP: 97/63  Pulse: 92  Resp: 18  Temp: 98.3 F (36.8 C)  SpO2: 97%     General appearance: alert; appears fatigued, but nontoxic; speaking in full sentences and tolerating own secretions HEENT: NCAT; Ears: EACs clear, TMs pearly gray; Eyes: PERRL.  EOM grossly intact, left ear middle ear effusion; sinuses: nontender; Nose: nares patent without rhinorrhea, Throat: oropharynx clear, tonsils non erythematous or enlarged, uvula midline  Lungs: unlabored respirations, symmetrical air entry; cough: absent; no respiratory distress; CTAB Heart: regular rate and rhythm.  Radial pulses 2+ symmetrical bilaterally Neuro: CN1-12 WNL Skin: warm and dry  Psychological: alert and cooperative; normal mood and affect  LABS:  No results found for this or any previous visit (from the past 24 hour(s)).   ASSESSMENT & PLAN:  1. Fever of unknown origin   2. Episodic cluster headache, not intractable     No orders of the defined types were placed in this encounter.   Discharge Instructions  Get plenty of rest and push fluids Take flonase for nasal congestion and runny nose Use medications daily for symptom relief Use OTC medications like ibuprofen or tylenol as needed fever or pain Call or go to the ED if you have any new or worsening symptoms such as fever, worsening cough, shortness of breath, chest tightness, chest pain, turning blue, changes in mental status, etc...   Reviewed expectations re: course of current medical issues. Questions answered. Outlined signs and symptoms indicating need for more acute intervention. Patient verbalized understanding. After Visit Summary given.      Note: This document was prepared using Dragon voice recognition software and may include unintentional dictation errors.      Emerson Monte, FNP 03/13/20 1114    Emerson Monte, FNP 03/13/20 1116

## 2020-03-13 NOTE — Discharge Instructions (Signed)
Get plenty of rest and push fluids Take flonase for nasal congestion and runny nose Use medications daily for symptom relief Use OTC medications like ibuprofen or tylenol as needed fever or pain Call or go to the ED if you have any new or worsening symptoms such as fever, worsening cough, shortness of breath, chest tightness, chest pain, turning blue, changes in mental status, etc..Marland Kitchen

## 2020-04-19 ENCOUNTER — Other Ambulatory Visit: Payer: Self-pay | Admitting: Family Medicine

## 2020-04-19 DIAGNOSIS — F418 Other specified anxiety disorders: Secondary | ICD-10-CM

## 2020-05-03 ENCOUNTER — Other Ambulatory Visit: Payer: Self-pay | Admitting: Family Medicine

## 2020-05-03 DIAGNOSIS — F411 Generalized anxiety disorder: Secondary | ICD-10-CM

## 2020-05-03 MED FILL — CITALOPRAM HBR 20 MG TABLET: 20 | 90 days supply | Qty: 90 | Fill #0

## 2020-05-19 ENCOUNTER — Other Ambulatory Visit: Payer: Self-pay

## 2020-05-19 ENCOUNTER — Encounter: Payer: 59 | Admitting: Family Medicine

## 2020-05-29 NOTE — Progress Notes (Signed)
Pt. Not seen

## 2020-06-01 DIAGNOSIS — Z20822 Contact with and (suspected) exposure to covid-19: Secondary | ICD-10-CM | POA: Diagnosis not present

## 2020-06-08 ENCOUNTER — Other Ambulatory Visit: Payer: Self-pay

## 2020-06-08 ENCOUNTER — Encounter: Payer: Self-pay | Admitting: Family Medicine

## 2020-06-08 ENCOUNTER — Ambulatory Visit: Payer: 59 | Admitting: Family Medicine

## 2020-06-08 ENCOUNTER — Other Ambulatory Visit: Payer: Self-pay | Admitting: Family Medicine

## 2020-06-08 VITALS — BP 114/69 | HR 82 | Temp 97.7°F | Ht 64.0 in | Wt 115.0 lb

## 2020-06-08 DIAGNOSIS — F411 Generalized anxiety disorder: Secondary | ICD-10-CM

## 2020-06-08 DIAGNOSIS — F418 Other specified anxiety disorders: Secondary | ICD-10-CM | POA: Diagnosis not present

## 2020-06-08 DIAGNOSIS — Z13220 Encounter for screening for lipoid disorders: Secondary | ICD-10-CM | POA: Diagnosis not present

## 2020-06-08 DIAGNOSIS — Z79899 Other long term (current) drug therapy: Secondary | ICD-10-CM

## 2020-06-08 DIAGNOSIS — Z23 Encounter for immunization: Secondary | ICD-10-CM

## 2020-06-08 DIAGNOSIS — R0600 Dyspnea, unspecified: Secondary | ICD-10-CM

## 2020-06-08 DIAGNOSIS — R0689 Other abnormalities of breathing: Secondary | ICD-10-CM | POA: Diagnosis not present

## 2020-06-08 MED ORDER — CITALOPRAM HYDROBROMIDE 20 MG PO TABS: 30.0000 mg | ORAL_TABLET | Freq: Every day | ORAL | 3 refills | Status: DC

## 2020-06-08 MED ORDER — ALBUTEROL SULFATE HFA 108 (90 BASE) MCG/ACT IN AERS: 2.0000 | INHALATION_SPRAY | Freq: Four times a day (QID) | RESPIRATORY_TRACT | 0 refills | Status: DC | PRN

## 2020-06-08 MED ORDER — ALPRAZOLAM 0.5 MG PO TABS: 0.5000 mg | ORAL_TABLET | Freq: Every evening | ORAL | 1 refills | Status: DC | PRN

## 2020-06-08 MED FILL — ALPRAZolam 0.5 MG TABS: 0.5 | 30 days supply | Qty: 30 | Fill #0

## 2020-06-08 MED FILL — ALBUTEROL SULFATE HFA 108 (: 108 (90 BAS | 25 days supply | Qty: 18 | Fill #0

## 2020-06-08 NOTE — Progress Notes (Signed)
Subjective: CC: Generalized anxiety disorder follow-up PCP: [name], DO TXM:IWOEH Squyres is a 59 y.o. female presenting to clinic today for:  1.  Anxiety disorder  She has never been hospitalized for mental health disorder.  No substance abuse.  Family history notable for some depressive disorder in siblings but nothing severe.    Symptoms have been moderately controlled on Celexa 20 mg.  She does report decreased libido with this and sometimes feels some anhedonia.  She continues to have situational anxiety specifically when she is going on a plane or car rides.  She continues to use the Xanax extremely rarely and in fact never refilled the prescription that I gave her last year.  Denies any excessive daytime sedation, falls, visual or auditory hallucinations when she does take the medicine.  She does not drink with the medicine.  ROS: Per HPI  Allergies  Allergen Reactions  . Acetaminophen Anaphylaxis and Other (See Comments)    Wheezing/hives  . Levofloxacin Other (See Comments)    Muscle spasms   Past Medical History:  Diagnosis Date  . Allergy   . Biliary colic   . GERD (gastroesophageal reflux disease)     Current Outpatient Medications:  .  albuterol (PROAIR HFA) 108 (90 Base) MCG/ACT inhaler, Inhale 2 puffs into the lungs every 6 (six) hours as needed., Disp: 1 Inhaler, Rfl: 11 .  ALPRAZolam (XANAX) 0.5 MG tablet, Take 1 tablet (0.5 mg total) by mouth at bedtime as needed for anxiety (travel)., Disp: 30 tablet, Rfl: 1 .  cephALEXin (KEFLEX) 500 MG capsule, Take 1 capsule (500 mg total) by mouth 2 (two) times daily., Disp: 14 capsule, Rfl: 0 .  cetirizine (ZYRTEC) 10 MG tablet, Take 10 mg by mouth daily., Disp: , Rfl:  .  citalopram (CELEXA) 20 MG tablet, TAKE 1 TABLET BY MOUTH ONCE DAILY, Disp: 90 tablet, Rfl: 0 .  fluticasone (FLONASE) 50 MCG/ACT nasal spray, Place 1 spray into both nostrils daily for 14 days., Disp: 16 g, Rfl: 0 .  ibuprofen (ADVIL,MOTRIN) 200  MG tablet, Take 400-600 mg by mouth daily as needed for headache or moderate pain. , Disp: , Rfl:  Social History   Socioeconomic History  . Marital status: Married    Spouse name: Joyce Hopkins "Joyce Hopkins"  . Number of children: 2  . Years of education: 37  . Highest education level: Not on file  Occupational History  . Occupation: part-time  Tobacco Use  . Smoking status: Never Smoker  . Smokeless tobacco: Never Used  Vaping Use  . Vaping Use: Never used  Substance and Sexual Activity  . Alcohol use: Yes    Alcohol/week: 0.0 standard drinks    Comment: rarely  . Drug use: No  . Sexual activity: Not on file  Other Topics Concern  . Not on file  Social History Narrative   Lives with husband   Caffeine use: 1 code per day   Coffee daily (very little)   Social Determinants of Health   Financial Resource Strain:   . Difficulty of Paying Living Expenses: Not on file  Food Insecurity:   . Worried About Charity fundraiser in the Last Year: Not on file  . Ran Out of Food in the Last Year: Not on file  Transportation Needs:   . Lack of Transportation (Medical): Not on file  . Lack of Transportation (Non-Medical): Not on file  Physical Activity:   . Days of Exercise per Week: Not on file  . Minutes  of Exercise per Session: Not on file  Stress:   . Feeling of Stress : Not on file  Social Connections:   . Frequency of Communication with Friends and Family: Not on file  . Frequency of Social Gatherings with Friends and Family: Not on file  . Attends Religious Services: Not on file  . Active Member of Clubs or Organizations: Not on file  . Attends Archivist Meetings: Not on file  . Marital Status: Not on file  Intimate Partner Violence:   . Fear of Current or Ex-Partner: Not on file  . Emotionally Abused: Not on file  . Physically Abused: Not on file  . Sexually Abused: Not on file   Family History  Problem Relation Age of Onset  . Heart disease Father   . Stroke Neg Hx    . Migraines Neg Hx   . Neuropathy Neg Hx   . Colon cancer Neg Hx   . Pancreatic cancer Neg Hx   . Celiac disease Neg Hx   . Ulcers Neg Hx   . Inflammatory bowel disease Neg Hx   . Colon polyps Neg Hx     Objective: Office vital signs reviewed. BP 114/69   Pulse 82   Temp 97.7 F (36.5 C) (Temporal)   Ht '5\' 4"'  (1.626 m)   Wt 115 lb (52.2 kg)   SpO2 99%   BMI 19.74 kg/m   Physical Examination:  General: Awake, alert, well nourished, No acute distress HEENT: Normal, sclera white Cardio: regular rate and rhythm, S1S2 heard, no murmurs appreciated Pulm: clear to auscultation bilaterally, no wheezes, rhonchi or rales; normal work of breathing on room air Psych: Mood stable, speech normal, affect appropriate, pleasant and interactive. Depression screen [facility] 2/9 06/08/2020 03/31/2019 10/11/2018  Decreased Interest 0 0 0  Down, Depressed, Hopeless 0 0 0  PHQ - 2 Score 0 0 0  Altered sleeping 0 0 -  Tired, decreased energy 0 0 -  Change in appetite 0 0 -  Feeling bad or failure about yourself  0 0 -  Trouble concentrating 0 0 -  Moving slowly or fidgety/restless 0 0 -  Suicidal thoughts 0 0 -  PHQ-9 Score 0 0 -   GAD 7 : Generalized Anxiety Score 06/08/2020 03/31/2019 07/17/2016 02/22/2016  Nervous, Anxious, on Edge 1 0 1 1  Control/stop worrying 0 0 0 1  Worry too much - different things 0 0 1 1  Trouble relaxing 0 0 0 2  Restless 2 1 0 1  Easily annoyed or irritable 0 0 0 1  Afraid - awful might happen 0 0 0 0  Total GAD 7 Score '3 1 2 7  ' Anxiety Difficulty Not difficult at all - Not difficult at all Not difficult at all   Assessment/ Plan: 59 y.o. female   1. Situational anxiety Extremely sparing use of Xanax.  The national narcotic database was reviewed and there were no red flags.  UDS and CSC were updated per office policy.  I am going to increase her Celexa to 30 mg daily.  She will try this for the next 2 to 4 weeks and if she finds that this is helpful she will  continue.  Otherwise okay to drop back to 20 mg daily.  She will come in for fasting labs in about 2 weeks.  She may follow-up for annual physical exam in 1 year, sooner if needed - ToxASSURE Select 13 (MW), Urine - ALPRAZolam (XANAX) 0.5 MG tablet;  Take 1 tablet (0.5 mg total) by mouth at bedtime as needed for anxiety (travel).  Dispense: 30 tablet; Refill: 1 - citalopram (CELEXA) 20 MG tablet; Take 1.5 tablets (30 mg total) by mouth daily.  Dispense: 135 tablet; Refill: 3 - CMP14+EGFR; Future - TSH; Future  2. Controlled substance agreement signed - ToxASSURE Select 13 (MW), Urine  3. Generalized anxiety disorder - citalopram (CELEXA) 20 MG tablet; Take 1.5 tablets (30 mg total) by mouth daily.  Dispense: 135 tablet; Refill: 3 - CMP14+EGFR; Future - TSH; Future  4. Dyspnea and respiratory abnormality Not needing but current inhaler is expired.  Renewal sent - albuterol (PROAIR HFA) 108 (90 Base) MCG/ACT inhaler; Inhale 2 puffs into the lungs every 6 (six) hours as needed for wheezing or shortness of breath.  Dispense: 1 each; Refill: 0  5. Screening, lipid - Lipid panel; Future  6. Need for immunization against influenza Administered during today's visit - Flu Vaccine QUAD 36+ mos IM   Orders Placed This Encounter  Procedures  . Varicella-zoster vaccine IM  . Flu Vaccine QUAD 36+ mos IM  . ToxASSURE Select 13 (MW), Urine  . CMP14+EGFR    Standing Status:   Future    Standing Expiration Date:   06/08/2021  . Lipid panel    Standing Status:   Future    Standing Expiration Date:   06/08/2021  . TSH    Standing Status:   Future    Standing Expiration Date:   06/08/2021   No orders of the defined types were placed in this encounter.    [name], DO [place] [phone]

## 2020-06-08 NOTE — Patient Instructions (Signed)
Anytime after 10/6 is fine to come in for fasting labs.

## 2020-06-11 LAB — TOXASSURE SELECT 13 (MW), URINE

## 2020-06-21 MED FILL — CITALOPRAM HBR 20 MG TABLET: 20 | 90 days supply | Qty: 135 | Fill #0

## 2020-08-25 ENCOUNTER — Ambulatory Visit: Payer: 59

## 2020-08-27 ENCOUNTER — Other Ambulatory Visit: Payer: Self-pay

## 2020-08-27 ENCOUNTER — Ambulatory Visit (INDEPENDENT_AMBULATORY_CARE_PROVIDER_SITE_OTHER): Payer: 59 | Admitting: *Deleted

## 2020-08-27 DIAGNOSIS — Z23 Encounter for immunization: Secondary | ICD-10-CM | POA: Diagnosis not present

## 2020-08-27 NOTE — Progress Notes (Signed)
Patient in today for her second shingles vaccine. Placed IM in left deltoid. Patient tolerated well.

## 2020-10-01 MED FILL — CITALOPRAM HBR 20 MG TABLET: 20 | 90 days supply | Qty: 135 | Fill #1

## 2020-10-15 ENCOUNTER — Encounter: Payer: Self-pay | Admitting: Family Medicine

## 2020-10-18 ENCOUNTER — Other Ambulatory Visit: Payer: Self-pay | Admitting: Family

## 2020-10-18 DIAGNOSIS — F411 Generalized anxiety disorder: Secondary | ICD-10-CM

## 2020-10-18 DIAGNOSIS — F418 Other specified anxiety disorders: Secondary | ICD-10-CM

## 2020-10-18 MED ORDER — CITALOPRAM HYDROBROMIDE 20 MG PO TABS: 20.0000 mg | ORAL_TABLET | Freq: Every day | ORAL | 3 refills | Status: DC

## 2020-10-26 NOTE — Progress Notes (Signed)
Referring Provider: Raliegh Ip, DO Primary Care Physician:  Raliegh Ip, DO Primary GI: Dr. Marletta Lor, previously Dr. Darrick Penna   Chief Complaint  Patient presents with  . Diarrhea    Daily, 2-4x/day. Started 2 weeks ago--had cramping and gas for 1 week before diarrhea started    HPI:   Joyce Hopkins is a 60 y.o. female presenting today with a history pertinent for abnormal LFTs in setting of acute illness/gallbladder sludge. Concern for beaded appearance of bile ducts. Seen at [facility]  Dec 2019 with normal LFTs. As liver tests were normal, it was recommended to hold off on repeat MRI/MRCP unless new signs/symptoms. Outside facility: normal 2012 colonoscopy   2-4 loose stools per day. Diarrhea for 2 weeks. Had gone to [facility] and had a good brisket sandwich but had the gas after that.  Week prior to this abdominal cramping, foul-smelling flatus. BMs were normal at that point. Doesn't feel "sick" but now feeling worn out. No rectal bleeding. No recent abx. No sick contacts. Well water. Has chickens. Mild abdominal cramping right before loose stools. Appetite is decent. No N/V.   Celexa had been bumped up to 30 mg in Sept 2021 and then decreased back to 20 mg on the 2/4.    Past Medical History:  Diagnosis Date  . Allergy   . Biliary colic   . GERD (gastroesophageal reflux disease)     Past Surgical History:  Procedure Laterality Date  . CHOLECYSTECTOMY N/A 07/19/2017   Procedure: LAPAROSCOPIC CHOLECYSTECTOMY WITH INTRAOPERATIVE CHOLANGIOGRAM;  Surgeon: Almond Lint, MD;  Location: [facility] OR;  Service: General;  Laterality: N/A;  . TMJ ARTHROPLASTY  1995    Current Outpatient Medications  Medication Sig Dispense Refill  . albuterol (PROAIR HFA) 108 (90 Base) MCG/ACT inhaler Inhale 2 puffs into the lungs every 6 (six) hours as needed for wheezing or shortness of breath. 1 each 0  . ALPRAZolam (XANAX) 0.5 MG tablet Take 1 tablet (0.5 mg total) by mouth at bedtime as  needed for anxiety (travel). 30 tablet 1  . cetirizine (ZYRTEC) 10 MG tablet Take 10 mg by mouth daily.    . citalopram (CELEXA) 20 MG tablet Take 1 tablet (20 mg total) by mouth daily. 90 tablet 3  . hyoscyamine (LEVSIN SL) 0.125 MG SL tablet Place 1 tablet (0.125 mg total) under the tongue every 4 (four) hours as needed. For cramping. Stop if constipation 30 tablet 0  . ibuprofen (ADVIL,MOTRIN) 200 MG tablet Take 400-600 mg by mouth daily as needed for headache or moderate pain.      No current facility-administered medications for this visit.    Allergies as of 10/27/2020 - Review Complete 10/27/2020  Allergen Reaction Noted  . Acetaminophen Anaphylaxis and Other (See Comments) 02/22/2016  . Levofloxacin Other (See Comments) 11/02/2010    Family History  Problem Relation Age of Onset  . Heart disease Father   . Stroke Neg Hx   . Migraines Neg Hx   . Neuropathy Neg Hx   . Colon cancer Neg Hx   . Pancreatic cancer Neg Hx   . Celiac disease Neg Hx   . Ulcers Neg Hx   . Inflammatory bowel disease Neg Hx   . Colon polyps Neg Hx     Social History   Socioeconomic History  . Marital status: Married    Spouse name: Gerri Spore "wes"  . Number of children: 2  . Years of education: 16  . Highest education  level: Not on file  Occupational History  . Occupation: part-time  Tobacco Use  . Smoking status: Never Smoker  . Smokeless tobacco: Never Used  Vaping Use  . Vaping Use: Never used  Substance and Sexual Activity  . Alcohol use: Yes    Alcohol/week: 0.0 standard drinks    Comment: a few times a week wine with supper  . Drug use: No  . Sexual activity: Not on file  Other Topics Concern  . Not on file  Social History Narrative   Lives with husband   Caffeine use: 1 code per day   Coffee daily (very little)   Social Determinants of Health   Financial Resource Strain: Not on file  Food Insecurity: Not on file  Transportation Needs: Not on file  Physical Activity: Not on  file  Stress: Not on file  Social Connections: Not on file    Review of Systems: Gen: Denies fever, chills, anorexia. Denies fatigue, weakness, weight loss.  CV: Denies chest pain, palpitations, syncope, peripheral edema, and claudication. Resp: Denies dyspnea at rest, cough, wheezing, coughing up blood, and pleurisy. GI: see HPI Derm: Denies rash, itching, dry skin Psych: Denies depression, anxiety, memory loss, confusion. No homicidal or suicidal ideation.  Heme: Denies bruising, bleeding, and enlarged lymph nodes.  Physical Exam: BP 118/72   Pulse 87   Temp (!) 97.3 F (36.3 C) (Temporal)   Ht 5\' 4"  (1.626 m)   Wt 114 lb (51.7 kg)   BMI 19.57 kg/m  General:   Alert and oriented. No distress noted. Pleasant and cooperative.  Head:  Normocephalic and atraumatic. Eyes:  Conjuctiva clear without scleral icterus. Mouth:  Mask in place Abdomen:  +BS, soft, non-tender and non-distended. No rebound or guarding. No HSM or masses noted. Msk:  Symmetrical without gross deformities. Normal posture. Extremities:  Without edema. Neurologic:  Alert and  oriented x4 Psych:  Alert and cooperative. Normal mood and affect.  ASSESSMENT: Joyce Hopkins is a 60 y.o. female presenting today with two week history of diarrhea after eating out at a restaurant, denying exposure to any recent antibiotics, sick contacts, but does have well water and chickens at home.  Need to rule out infectious etiology. She does not appear acutely ill. Discussed holding off on any supportive measures (anti-spasmodics) until after stool studies, but she is concerned and requesting this today. I discussed using only sparingly for cramping.    PLAN:  Stool studies (Cdiff and GI pathogen panel ordered) Very sparingly Levsin prn Routine screening colonoscopy once over acute illness   Gelene Mink, PhD, ANP-BC [facility]

## 2020-10-27 ENCOUNTER — Encounter: Payer: Self-pay | Admitting: Gastroenterology

## 2020-10-27 ENCOUNTER — Other Ambulatory Visit: Payer: 59

## 2020-10-27 ENCOUNTER — Ambulatory Visit (INDEPENDENT_AMBULATORY_CARE_PROVIDER_SITE_OTHER): Payer: 59 | Admitting: Gastroenterology

## 2020-10-27 ENCOUNTER — Other Ambulatory Visit: Payer: Self-pay

## 2020-10-27 VITALS — BP 118/72 | HR 87 | Temp 97.3°F | Ht 64.0 in | Wt 114.0 lb

## 2020-10-27 DIAGNOSIS — R197 Diarrhea, unspecified: Secondary | ICD-10-CM

## 2020-10-27 MED ORDER — HYOSCYAMINE SULFATE 0.125 MG SL SUBL: 0.1250 mg | SUBLINGUAL_TABLET | SUBLINGUAL | 0 refills | Status: AC | PRN

## 2020-10-27 NOTE — Patient Instructions (Signed)
Please complete the stool studies.   I have sent in Levsin to take every 4-6 hours as needed for abdominal cramping. I would limit this to sparingly and stop if constipation or worsening pain.   Further recommendations to follow!  I enjoyed seeing you again today! As you know, I value our relationship and want to provide genuine, compassionate, and quality care. I welcome your feedback. If you receive a survey regarding your visit,  I greatly appreciate you taking time to fill this out. See you next time!  Annitta Needs, PhD, ANP-BC [facility]

## 2020-10-28 ENCOUNTER — Other Ambulatory Visit: Payer: 59

## 2020-10-28 NOTE — Progress Notes (Signed)
CC'ED TO PCP 

## 2020-10-30 LAB — CLOSTRIDIUM DIFFICILE EIA: C difficile Toxins A+B, EIA: NEGATIVE

## 2020-10-31 LAB — GI PROFILE, STOOL, PCR

## 2020-11-01 ENCOUNTER — Other Ambulatory Visit: Payer: Self-pay | Admitting: Gastroenterology

## 2020-11-01 MED ORDER — VANCOMYCIN HCL 125 MG PO CAPS: 125.0000 mg | ORAL_CAPSULE | Freq: Four times a day (QID) | ORAL | 0 refills | Status: AC

## 2020-11-19 DIAGNOSIS — Z20822 Contact with and (suspected) exposure to covid-19: Secondary | ICD-10-CM | POA: Diagnosis not present

## 2021-01-31 DIAGNOSIS — H5203 Hypermetropia, bilateral: Secondary | ICD-10-CM | POA: Diagnosis not present

## 2021-01-31 DIAGNOSIS — H2513 Age-related nuclear cataract, bilateral: Secondary | ICD-10-CM | POA: Diagnosis not present

## 2021-01-31 DIAGNOSIS — H524 Presbyopia: Secondary | ICD-10-CM | POA: Diagnosis not present

## 2021-03-02 ENCOUNTER — Other Ambulatory Visit (HOSPITAL_COMMUNITY): Payer: Self-pay

## 2021-03-02 MED FILL — Citalopram Hydrobromide Tab 20 MG (Base Equiv): ORAL | 90 days supply | Qty: 90 | Fill #0 | Status: AC

## 2021-05-05 ENCOUNTER — Other Ambulatory Visit (HOSPITAL_COMMUNITY): Payer: Self-pay

## 2021-05-05 ENCOUNTER — Encounter: Payer: Self-pay | Admitting: Family Medicine

## 2021-05-11 ENCOUNTER — Encounter: Payer: Self-pay | Admitting: Family Medicine

## 2021-05-11 ENCOUNTER — Ambulatory Visit: Payer: 59 | Admitting: Family Medicine

## 2021-05-11 ENCOUNTER — Other Ambulatory Visit (HOSPITAL_COMMUNITY): Payer: Self-pay

## 2021-05-11 ENCOUNTER — Other Ambulatory Visit: Payer: Self-pay

## 2021-05-11 VITALS — BP 108/64 | HR 81 | Temp 98.2°F | Resp 20 | Ht 64.0 in | Wt 114.0 lb

## 2021-05-11 DIAGNOSIS — Z79899 Other long term (current) drug therapy: Secondary | ICD-10-CM | POA: Diagnosis not present

## 2021-05-11 DIAGNOSIS — J019 Acute sinusitis, unspecified: Secondary | ICD-10-CM

## 2021-05-11 DIAGNOSIS — D72829 Elevated white blood cell count, unspecified: Secondary | ICD-10-CM

## 2021-05-11 DIAGNOSIS — F418 Other specified anxiety disorders: Secondary | ICD-10-CM | POA: Diagnosis not present

## 2021-05-11 DIAGNOSIS — Z13220 Encounter for screening for lipoid disorders: Secondary | ICD-10-CM

## 2021-05-11 DIAGNOSIS — B9689 Other specified bacterial agents as the cause of diseases classified elsewhere: Secondary | ICD-10-CM | POA: Diagnosis not present

## 2021-05-11 DIAGNOSIS — E871 Hypo-osmolality and hyponatremia: Secondary | ICD-10-CM | POA: Diagnosis not present

## 2021-05-11 DIAGNOSIS — F411 Generalized anxiety disorder: Secondary | ICD-10-CM | POA: Diagnosis not present

## 2021-05-11 MED ORDER — ALPRAZOLAM 0.5 MG PO TABS
0.5000 mg | ORAL_TABLET | Freq: Every evening | ORAL | 1 refills | Status: DC | PRN
  Filled 2021-05-11: qty 30, 30d supply, fill #0

## 2021-05-11 MED ORDER — AMOXICILLIN-POT CLAVULANATE 875-125 MG PO TABS: 1.0000 | ORAL_TABLET | Freq: Two times a day (BID) | ORAL | 0 refills | Status: DC

## 2021-05-11 NOTE — Progress Notes (Signed)
Subjective: CC: anxiety PCP: [name], DO JEH:UDJSH Joyce Hopkins is a 60 y.o. female presenting to clinic today for:  1. Anxiety Patient suffers from situational anxiety.  She was last seen for this issue in September 2021.  She has utilized her medication very sparingly and typically only when she travels.  She did use it a little more during COVID-19 infection due to need for sleep.  Denies any excessive daytime sedation, falls, respiratory depression, visual auditory hallucinations.  No known memory loss.  Compliant with Celexa.  2.  COVID-19 infection Patient is recovering from COVID-19 infection.  She reports overall improvement in symptoms except persistent nasal congestion despite use of over-the-counter nasal spray at bedtime.  She do not want to become dependent on nasal sprays only using at bedtime.  Not sure what kind she is using however.  Reports some purulent, yellow discharge from the nares over the last several days.  Has persistent sinus pressure.  No hemoptysis, shortness of breath or cough.   ROS: Per HPI  Allergies  Allergen Reactions   Acetaminophen Anaphylaxis and Other (See Comments)    Wheezing/hives   Levofloxacin Other (See Comments)    Muscle spasms   Past Medical History:  Diagnosis Date   Allergy    Biliary colic    GERD (gastroesophageal reflux disease)     Current Outpatient Medications:    albuterol (PROAIR HFA) 108 (90 Base) MCG/ACT inhaler, Inhale 2 puffs into the lungs every 6 (six) hours as needed for wheezing or shortness of breath., Disp: 1 each, Rfl: 0   amoxicillin-clavulanate (AUGMENTIN) 875-125 MG tablet, Take 1 tablet by mouth 2 (two) times daily., Disp: 20 tablet, Rfl: 0   cetirizine (ZYRTEC) 10 MG tablet, Take 10 mg by mouth daily., Disp: , Rfl:    citalopram (CELEXA) 20 MG tablet, TAKE 1 TABLET BY MOUTH DAILY, Disp: 90 tablet, Rfl: 3   ibuprofen (ADVIL,MOTRIN) 200 MG tablet, Take 400-600 mg by mouth daily as needed for headache or  moderate pain. , Disp: , Rfl:    ALPRAZolam (XANAX) 0.5 MG tablet, Take 1 tablet (0.5 mg total) by mouth at bedtime as needed for anxiety (travel)., Disp: 30 tablet, Rfl: 1   hyoscyamine (LEVSIN SL) 0.125 MG SL tablet, Place 1 tablet (0.125 mg total) under the tongue every 4 (four) hours as needed. For cramping. Stop if constipation (Patient not taking: Reported on 05/11/2021), Disp: 30 tablet, Rfl: 0 Social History   Socioeconomic History   Marital status: Married    Spouse name: Lake Bells "wes"   Number of children: 2   Years of education: 16   Highest education level: Not on file  Occupational History   Occupation: part-time  Tobacco Use   Smoking status: Never   Smokeless tobacco: Never  Vaping Use   Vaping Use: Never used  Substance and Sexual Activity   Alcohol use: Yes    Alcohol/week: 0.0 standard drinks    Comment: a few times a week wine with supper   Drug use: No   Sexual activity: Not on file  Other Topics Concern   Not on file  Social History Narrative   Lives with husband   Caffeine use: 1 code per day   Coffee daily (very little)   Social Determinants of Health   Financial Resource Strain: Not on file  Food Insecurity: Not on file  Transportation Needs: Not on file  Physical Activity: Not on file  Stress: Not on file  Social Connections: Not on  file  Intimate Partner Violence: Not on file   Family History  Problem Relation Age of Onset   Heart disease Father    Stroke Neg Hx    Migraines Neg Hx    Neuropathy Neg Hx    Colon cancer Neg Hx    Pancreatic cancer Neg Hx    Celiac disease Neg Hx    Ulcers Neg Hx    Inflammatory bowel disease Neg Hx    Colon polyps Neg Hx     Objective: Office vital signs reviewed. BP 108/64   Pulse 81   Temp 98.2 F (36.8 C)   Resp 20   Ht _0  (1.626 m)   Wt 114 lb (51.7 kg)   SpO2 98%   BMI 19.57 kg/m   Physical Examination:  General: Awake, alert, well appearing female, No acute distress HEENT: Normal     Neck: No masses palpated. No lymphadenopathy    Ears: Tympanic membranes intact, normal light reflex, no erythema, no bulging    Eyes: PERRLA, extraocular membranes intact, sclera white    Nose: nasal turbinates moist but erythematous and edematous, clear/ yellow copius nasal discharge    Throat: moist mucus membranes, mild to moderate oropharyngeal erythema, no appreciable tonsillar exudate.  Airway is patent Cardio: regular rate and rhythm, S1S2 heard, no murmurs appreciated Pulm: clear to auscultation bilaterally, no wheezes, rhonchi or rales; normal work of breathing on room air MSK: normal gait and station Psych: Mood stable, speech normal.  Patient is pleasant and interactive. Depression screen [facility] 2/9 05/11/2021 06/08/2020 03/31/2019  Decreased Interest 0 0 0  Down, Depressed, Hopeless 0 0 0  PHQ - 2 Score 0 0 0  Altered sleeping 0 0 0  Tired, decreased energy 0 0 0  Change in appetite 0 0 0  Feeling bad or failure about yourself  0 0 0  Trouble concentrating 0 0 0  Moving slowly or fidgety/restless 0 0 0  Suicidal thoughts 0 0 0  PHQ-9 Score 0 0 0  Difficult doing work/chores Somewhat difficult - -   GAD 7 : Generalized Anxiety Score 05/11/2021 06/08/2020 03/31/2019 07/17/2016  Nervous, Anxious, on Edge 1 1 0 1  Control/stop worrying 0 0 0 0  Worry too much - different things 0 0 0 1  Trouble relaxing 0 0 0 0  Restless 0 2 1 0  Easily annoyed or irritable - 0 0 0  Afraid - awful might happen 0 0 0 0  Total GAD 7 Score - _1 Anxiety Difficulty Not difficult at all Not difficult at all - Not difficult at all    Assessment/ Plan: 60 y.o. female   Situational anxiety - Plan: TSH, Drug Screen 10 W/Conf, Se, ALPRAZolam (XANAX) 0.5 MG tablet  Generalized anxiety disorder - Plan: TSH, Drug Screen 10 W/Conf, Se  Controlled substance agreement signed - Plan: Drug Screen 10 W/Conf, Se  Acute bacterial sinusitis - Plan: amoxicillin-clavulanate (AUGMENTIN) 875-125 MG  tablet  Hyponatremia - Plan: CMP14+EGFR  Leukocytosis, unspecified type - Plan: CBC with Differential  Screening, lipid - Plan: Lipid Panel  Sparing use of alprazolam.  No red flag signs or symptoms.  Renewal sent.  Updated drug screen and CSC were obtained as per office policy. The Narcotic Database has been reviewed.  There were no red flags.    Symptoms are consistent with acute bacterial sinusitis.  Augmentin sent.  Home care instructions reviewed.  Handout provided.  Follow-up as needed on this issue  We will collect some labs prior to her anticipated annual physical in October.  Noted to be hyponatremic in 2020.  Recheck sodium level, liver enzymes, albumin.  Noted to have elevation in white blood cell count in 2020 so we will recheck a CBC as well  BMI low normal.  No previous findings of hyperlipidemia but since has been 4 years since last check we will obtain a screening lipid today.  She is fasting  Orders Placed This Encounter  Procedures   CMP14+EGFR   CBC with Differential   TSH   Drug Screen 10 W/Conf, Se   Lipid Panel   Meds ordered this encounter  Medications   amoxicillin-clavulanate (AUGMENTIN) 875-125 MG tablet    Sig: Take 1 tablet by mouth 2 (two) times daily.    Dispense:  20 tablet    Refill:  0   ALPRAZolam (XANAX) 0.5 MG tablet    Sig: Take 1 tablet (0.5 mg total) by mouth at bedtime as needed for anxiety (travel).    Dispense:  30 tablet    Refill:  [place], DO [place] [phone]

## 2021-05-11 NOTE — Patient Instructions (Addendum)
To do list: - physical exam - pap smear - colon cancer screening - mammogram   You had labs performed today.  You will be contacted with the results of the labs once they are available, usually in the next 3 business days for routine lab work.  If you have an active my chart account, they will be released to your MyChart.  If you prefer to have these labs released to you via telephone, please let us know.  We discussed the long term effects of your medication today.  Xanax can cause hallucinations, falls, breathing problems, dementia and even death.  While I do NOT recommend you abruptly discontinue the medication (as this can lead to life threatening withdrawal), I do recommend we reduce your dose and ultimately get you off of this medication.  - Get plenty of rest and drink plenty of fluids. - Try to breathe moist air. Use a cold mist humidifier. - Consume warm fluids (soup or tea) to provide relief for a stuffy nose and to loosen phlegm. - For nasal stuffiness, try saline nasal spray or a Neti Pot. Afrin nasal spray can also be used but this product should not be used longer than 3 days or it will cause rebound nasal stuffiness (worsening nasal congestion).  Recommend Nasonex/ Flonase for daily use if needed for nasal decongestion - For sore throat pain relief: use chloraseptic spray, suck on throat lozenges, hard candy or popsicles; gargle with warm salt water (1/4 tsp. salt per 8 oz. of water); and eat soft, bland foods. - Eat a well-balanced diet. If you cannot, ensure you are getting enough nutrients by taking a daily multivitamin. - Avoid dairy products, as they can thicken phlegm. - Avoid alcohol, as it impairs your body's immune system.  CONTACT YOUR DOCTOR IF YOU EXPERIENCE ANY OF THE FOLLOWING: - High fever - Ear pain - Sinus-type headache - Unusually severe cold symptoms - Cough that gets worse while other cold symptoms improve - Flare up of any chronic lung problem, such as  asthma - Your symptoms persist longer than 2 weeks

## 2021-05-17 LAB — LIPID PANEL
Chol/HDL Ratio: 4.1 ratio (ref 0.0–4.4)
Cholesterol, Total: 192 mg/dL (ref 100–199)
HDL: 47 mg/dL (ref >39)
LDL Chol Calc (NIH): 119 mg/dL — ABNORMAL HIGH (ref 0–99)
Triglycerides: 148 mg/dL (ref 0–149)
VLDL Cholesterol Cal: 26 mg/dL (ref 5–40)

## 2021-05-17 LAB — CBC WITH DIFFERENTIAL/PLATELET
Basophils Absolute: 0 10*3/uL (ref 0.0–0.2)
Basos: 1 %
EOS (ABSOLUTE): 0.2 10*3/uL (ref 0.0–0.4)
Eos: 3 %
Hematocrit: 36.3 % (ref 34.0–46.6)
Hemoglobin: 12.1 g/dL (ref 11.1–15.9)
Immature Grans (Abs): 0.1 10*3/uL (ref 0.0–0.1)
Immature Granulocytes: 1 %
Lymphocytes Absolute: 1.8 10*3/uL (ref 0.7–3.1)
Lymphs: 28 %
MCH: 29.4 pg (ref 26.6–33.0)
MCHC: 33.3 g/dL (ref 31.5–35.7)
MCV: 88 fL (ref 79–97)
Monocytes Absolute: 0.5 10*3/uL (ref 0.1–0.9)
Monocytes: 8 %
Neutrophils Absolute: 3.7 10*3/uL (ref 1.4–7.0)
Neutrophils: 59 %
Platelets: 280 10*3/uL (ref 150–450)
RBC: 4.12 x10E6/uL (ref 3.77–5.28)
RDW: 12.7 % (ref 11.7–15.4)
WBC: 6.3 10*3/uL (ref 3.4–10.8)

## 2021-05-17 LAB — DRUG SCREEN 10 W/CONF, SERUM
Amphetamines, IA: NEGATIVE ng/mL
Barbiturates, IA: NEGATIVE ug/mL
Benzodiazepines, IA: NEGATIVE ng/mL
Cocaine & Metabolite, IA: NEGATIVE ng/mL
Methadone, IA: NEGATIVE ng/mL
Opiates, IA: NEGATIVE ng/mL
Oxycodones, IA: NEGATIVE ng/mL
Phencyclidine, IA: NEGATIVE ng/mL
Propoxyphene, IA: NEGATIVE ng/mL
THC(Marijuana) Metabolite, IA: NEGATIVE ng/mL

## 2021-05-17 LAB — CMP14+EGFR
ALT: 10 IU/L (ref 0–32)
AST: 15 IU/L (ref 0–40)
Albumin/Globulin Ratio: 1.8 (ref 1.2–2.2)
Albumin: 4.2 g/dL (ref 3.8–4.9)
Alkaline Phosphatase: 71 IU/L (ref 44–121)
BUN/Creatinine Ratio: 19 (ref 12–28)
BUN: 12 mg/dL (ref 8–27)
Bilirubin Total: 0.2 mg/dL (ref 0.0–1.2)
CO2: 25 mmol/L (ref 20–29)
Calcium: 9.3 mg/dL (ref 8.7–10.3)
Chloride: 101 mmol/L (ref 96–106)
Creatinine, Ser: 0.64 mg/dL (ref 0.57–1.00)
Globulin, Total: 2.4 g/dL (ref 1.5–4.5)
Glucose: 78 mg/dL (ref 65–99)
Potassium: 4 mmol/L (ref 3.5–5.2)
Sodium: 141 mmol/L (ref 134–144)
Total Protein: 6.6 g/dL (ref 6.0–8.5)
eGFR: 101 mL/min/{1.73_m2} (ref >59)

## 2021-05-17 LAB — TSH: TSH: 1.58 u[IU]/mL (ref 0.450–4.500)

## 2021-06-01 ENCOUNTER — Other Ambulatory Visit (HOSPITAL_COMMUNITY): Payer: Self-pay

## 2021-06-01 MED FILL — Citalopram Hydrobromide Tab 20 MG (Base Equiv): ORAL | 90 days supply | Qty: 90 | Fill #1 | Status: AC

## 2021-06-23 ENCOUNTER — Other Ambulatory Visit: Payer: Self-pay

## 2021-06-23 ENCOUNTER — Other Ambulatory Visit (HOSPITAL_COMMUNITY): Payer: Self-pay

## 2021-06-23 ENCOUNTER — Encounter: Payer: Self-pay | Admitting: Family Medicine

## 2021-06-23 ENCOUNTER — Other Ambulatory Visit (HOSPITAL_COMMUNITY)
Admission: RE | Admit: 2021-06-23 | Discharge: 2021-06-23 | Disposition: A | Discharge status: Home / Self Care | Payer: 59 | Source: Ambulatory Visit | Attending: Family Medicine | Admitting: Family Medicine

## 2021-06-23 ENCOUNTER — Ambulatory Visit (INDEPENDENT_AMBULATORY_CARE_PROVIDER_SITE_OTHER): Payer: 59 | Admitting: Family Medicine

## 2021-06-23 VITALS — BP 110/66 | HR 70 | Temp 98.1°F | Ht 64.0 in | Wt 116.6 lb

## 2021-06-23 DIAGNOSIS — F411 Generalized anxiety disorder: Secondary | ICD-10-CM

## 2021-06-23 DIAGNOSIS — S5002XA Contusion of left elbow, initial encounter: Secondary | ICD-10-CM

## 2021-06-23 DIAGNOSIS — Z0001 Encounter for general adult medical examination with abnormal findings: Secondary | ICD-10-CM | POA: Diagnosis not present

## 2021-06-23 DIAGNOSIS — L821 Other seborrheic keratosis: Secondary | ICD-10-CM

## 2021-06-23 DIAGNOSIS — Z0000 Encounter for general adult medical examination without abnormal findings: Secondary | ICD-10-CM

## 2021-06-23 DIAGNOSIS — Z124 Encounter for screening for malignant neoplasm of cervix: Secondary | ICD-10-CM

## 2021-06-23 DIAGNOSIS — Z23 Encounter for immunization: Secondary | ICD-10-CM

## 2021-06-23 DIAGNOSIS — R200 Anesthesia of skin: Secondary | ICD-10-CM

## 2021-06-23 MED ORDER — CITALOPRAM HYDROBROMIDE 20 MG PO TABS
ORAL_TABLET | Freq: Every day | ORAL | 3 refills | Status: DC
Start: 2021-06-23 — End: 2022-05-12
  Filled 2021-06-23: qty 90, fill #0
  Filled 2021-08-28: qty 90, 90d supply, fill #0
  Filled 2021-11-30: qty 90, 90d supply, fill #1
  Filled 2022-02-24: qty 90, 90d supply, fill #2

## 2021-06-23 NOTE — Progress Notes (Signed)
Joyce Hopkins is a 60 y.o. female presents to office today for annual physical exam examination.    Concerns today include: 1.  Skin lesion Patient reports a skin lesion on the right shoulder.  She does not report any spontaneous bleeding or sudden change in color or size but wanted make sure that it is okay as there is a familial history of melanoma in her sister  2.  Elbow pain Patient reports that she injured her left elbow about 5 weeks ago.  She still has a small area of tenderness and soft tissue swelling that she wanted to have looked at today.  She is having no issues moving her upper extremity.  3.  Perioral numbness Patient reports a almost numb sensation around her mouth that is been present for the last couple of weeks.  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  No oral injury that she knows of.  No reports of exacerbation in her anxiety.  She has both Celexa and Xanax if needed.  Her most recent lab results demonstrated no electrolyte abnormalities and no anemia.  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, water), Marital status: married, Substance use: none Diet: balance, Exercise: active Last eye exam: UTD Last dental exam: next week Last colonoscopy: UTD Last mammogram: UTD Last pap smear: needs Refills needed today: none Immunizations needed: Immunization History  Administered Date(s) Administered   Hepatitis A, Adult 05/07/2018, 11/07/2018   Influenza Inj Mdck Quad Pf 06/07/2018   Influenza Split 07/10/2013   Influenza,inj,Quad PF,6+ Mos 06/24/2016, 06/05/2017, 06/08/2020   Influenza-Unspecified 06/05/2017, 06/07/2018   Moderna Sars-Covid-2 Vaccination 12/05/2019, 01/07/2020   Tdap 01/09/2006   Zoster Recombinat (Shingrix) 06/08/2020, 08/27/2020     Past Medical History:  Diagnosis Date   Allergy    Biliary colic    GERD  (gastroesophageal reflux disease)    Social History   Socioeconomic History   Marital status: Married    Spouse name: Lake Bells "wes"   Number of children: 2   Years of education: 16   Highest education level: Not on file  Occupational History   Occupation: part-time  Tobacco Use   Smoking status: Never   Smokeless tobacco: Never  Vaping Use   Vaping Use: Never used  Substance and Sexual Activity   Alcohol use: Yes    Alcohol/week: 0.0 standard drinks    Comment: a few times a week wine with supper   Drug use: No   Sexual activity: Not on file  Other Topics Concern   Not on file  Social History Narrative   Lives with husband   Caffeine use: 1 code per day   Coffee daily (very little)   Social Determinants of Health   Financial Resource Strain: Not on file  Food Insecurity: Not on file  Transportation Needs: Not on file  Physical Activity: Not on file  Stress: Not on file  Social Connections: Not on file  Intimate Partner Violence: Not on file   Past Surgical History:  Procedure Laterality Date   CHOLECYSTECTOMY N/A 07/19/2017   Procedure: LAPAROSCOPIC CHOLECYSTECTOMY WITH INTRAOPERATIVE CHOLANGIOGRAM;  Surgeon: Stark Klein, MD;  Location: [facility];  Service: General;  Laterality: N/A;   TMJ ARTHROPLASTY  1995   Family History  Problem Relation Age of Onset   Heart disease Father    Stroke Neg Hx    Migraines Neg Hx  Neuropathy Neg Hx    Colon cancer Neg Hx    Pancreatic cancer Neg Hx    Celiac disease Neg Hx    Ulcers Neg Hx    Inflammatory bowel disease Neg Hx    Colon polyps Neg Hx     Current Outpatient Medications:    albuterol (PROAIR HFA) 108 (90 Base) MCG/ACT inhaler, Inhale 2 puffs into the lungs every 6 (six) hours as needed for wheezing or shortness of breath., Disp: 1 each, Rfl: 0   ALPRAZolam (XANAX) 0.5 MG tablet, Take 1 tablet (0.5 mg total) by mouth at bedtime as needed for anxiety (travel)., Disp: 30 tablet, Rfl: 1   cetirizine (ZYRTEC) 10  MG tablet, Take 10 mg by mouth daily., Disp: , Rfl:    citalopram (CELEXA) 20 MG tablet, TAKE 1 TABLET BY MOUTH DAILY, Disp: 90 tablet, Rfl: 3   hyoscyamine (LEVSIN SL) 0.125 MG SL tablet, Place 1 tablet (0.125 mg total) under the tongue every 4 (four) hours as needed. For cramping. Stop if constipation, Disp: 30 tablet, Rfl: 0   ibuprofen (ADVIL,MOTRIN) 200 MG tablet, Take 400-600 mg by mouth daily as needed for headache or moderate pain. , Disp: , Rfl:   Allergies  Allergen Reactions   Acetaminophen Anaphylaxis and Other (See Comments)    Wheezing/hives   Levofloxacin Other (See Comments)    Muscle spasms     ROS: Review of Systems Pertinent items noted in HPI and remainder of comprehensive ROS otherwise negative.    Physical exam BP 110/66   Pulse 70   Temp 98.1 F (36.7 C)   Ht 5\' 4"  (1.626 m)   Wt 116 lb 9.6 oz (52.9 kg)   SpO2 97%   BMI 20.01 kg/m  General appearance: alert, cooperative, appears stated age, and no distress Head: Normocephalic, without obvious abnormality, atraumatic Eyes: conjunctivae/corneas clear. PERRL, EOM's intact. Fundi benign. Ears: normal TM's and external ear canals both ears Nose: Nares normal. Septum midline. Mucosa normal. No drainage or sinus tenderness. Throat:  Mucosal aspect of the right upper lip with changes consistent with canker sore.  No gingival pallor, bleeding.  No mucosal edema. Neck: no adenopathy, supple, symmetrical, trachea midline, and thyroid not enlarged, symmetric, no tenderness/mass/nodules Back: symmetric, no curvature. ROM normal. No CVA tenderness. Lungs: clear to auscultation bilaterally Heart: regular rate and rhythm, S1, S2 normal, no murmur, click, rub or gallop Abdomen: soft, non-tender; bowel sounds normal; no masses,  no organomegaly Pelvic: cervix normal in appearance, external genitalia normal, no adnexal masses or tenderness, no cervical motion tenderness, rectovaginal septum normal, uterus normal size, shape,  and consistency, vagina normal without discharge, and cervix positioned to patient's right Extremities: extremities normal, atraumatic, no cyanosis or edema Pulses: 2+ and symmetric Skin:  Multiple pigmented nevi noted throughout.  Lesion of concern is a 2 mm seborrheic keratosis noted on the apex of the right shoulder Lymph nodes: Cervical, supraclavicular, and axillary nodes normal. 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   Annual physical exam  Screening for malignant neoplasm of cervix - Plan: Cytology - PAP  Need for immunization against influenza - Plan: Flu Vaccine QUAD 54mo+IM (Fluarix, Fluzone & Alfiuria Quad PF)  Seborrheic keratoses  Contusion of left elbow, initial encounter  Perioral numbness  Generalized anxiety disorder - Plan: citalopram (CELEXA) 20 MG tablet  Tetanus and influenza vaccinations administered  Pap smear collected.  Cotesting performed  Lesion of concern on the shoulder appears to  be a seborrheic keratosis.  Monitor for signs and symptoms of change  Left elbow likely with contusion.  Since this is 5 weeks out doubt that if there was a fracture it would be visible at this point  Uncertain etiology of perioral numbness.  She had a lesion on the mucosal aspect of the upper lip that seem consistent with may be the start of a canker sore.  There was no gross mucosal edema or other abnormalities. ?  Manifestation of anxiety  Kaytelyn Glore M. Lajuana Ripple, DO

## 2021-06-23 NOTE — Patient Instructions (Addendum)
You had labs performed today.  You will be contacted with the results of the labs once they are available, usually in the next 3 business days for routine lab work.  If you have an active my chart account, they will be released to your MyChart.  If you prefer to have these labs released to you via telephone, please let us know.  If you had a pap smear or biopsy performed, expect to be contacted in about 7-10 days.  Seborrheic Keratosis A seborrheic keratosis is a common, noncancerous (benign) skin growth. These growths are velvety, waxy, rough, tan, brown, or black spots that appear on the skin. These skin growths can be flat or raised, and scaly. What are the causes? The cause of this condition is not known. What increases the risk? You are more likely to develop this condition if you: Have a family history of seborrheic keratosis. Are 50 or older. Are pregnant. Have had estrogen replacement therapy. What are the signs or symptoms? Symptoms of this condition include growths on the face, chest, shoulders, back, or other areas. These growths: Are usually painless, but may become irritated and itchy. Can be yellow, brown, black, or other colors. Are slightly raised or have a flat surface. Are sometimes rough or wart-like in texture. Are often velvety or waxy on the surface. Are round or oval-shaped. Often occur in groups, but may occur as a single growth. How is this diagnosed? This condition is diagnosed with a medical history and physical exam. A sample of the growth may be tested (skin biopsy). You may need to see a skin specialist (dermatologist). How is this treated? Treatment is not usually needed for this condition, unless the growths are irritated or bleed often. You may also choose to have the growths removed if you do not like their appearance. Most commonly, these growths are treated with a procedure in which liquid nitrogen is applied to "freeze" off the growth  (cryosurgery). They may also be burned off with electricity (electrocautery) or removed by scraping (curettage). Follow these instructions at home: Watch your growth for any changes. Keep all follow-up visits as told by your health care provider. This is important. Do not scratch or pick at the growth or growths. This can cause them to become irritated or infected. Contact a health care provider if: You suddenly have many new growths. Your growth bleeds, itches, or hurts. Your growth suddenly becomes larger or changes color. Summary A seborrheic keratosis is a common, noncancerous (benign) skin growth. Treatment is not usually needed for this condition, unless the growths are irritated or bleed often. Watch your growth for any changes. Contact a health care provider if you suddenly have many new growths or your growth suddenly becomes larger or changes color. Keep all follow-up visits as told by your health care provider. This is important. This information is not intended to replace advice given to you by your health care provider. Make sure you discuss any questions you have with your health care provider. Document Revised: 01/10/2018 Document Reviewed: 01/10/2018 Elsevier Patient Education  2022 Elsevier Inc[employer]   Preventive Care 83-21 Years Old, Female Preventive care refers to lifestyle choices and visits with your health care provider that can promote health and wellness. This includes: A yearly physical exam. This is also called an annual wellness visit. Regular dental and eye exams. Immunizations. Screening for certain conditions. Healthy lifestyle choices, such as: Eating a healthy diet. Getting regular exercise. Not using drugs or products that contain nicotine  and tobacco. Limiting alcohol use. What can I expect for my preventive care visit? Physical exam Your health care provider will check your: Height and weight. These may be used to calculate your BMI (body mass  index). BMI is a measurement that tells if you are at a healthy weight. Heart rate and blood pressure. Body temperature. Skin for abnormal spots. Counseling Your health care provider may ask you questions about your: Past medical problems. Family's medical history. Alcohol, tobacco, and drug use. Emotional well-being. Home life and relationship well-being. Sexual activity. Diet, exercise, and sleep habits. Work and work Statistician. Access to firearms. Method of birth control. Menstrual cycle. Pregnancy history. What immunizations do I need? Vaccines are usually given at various ages, according to a schedule. Your health care provider will recommend vaccines for you based on your age, medical history, and lifestyle or other factors, such as travel or where you work. What tests do I need? Blood tests Lipid and cholesterol levels. These may be checked every 5 years, or more often if you are over 69 years old. Hepatitis C test. Hepatitis B test. Screening Lung cancer screening. You may have this screening every year starting at age 48 if you have a 30-pack-year history of smoking and currently smoke or have quit within the past 15 years. Colorectal cancer screening. All adults should have this screening starting at age 58 and continuing until age 40. Your health care provider may recommend screening at age 7 if you are at increased risk. You will have tests every 1-10 years, depending on your results and the type of screening test. Diabetes screening. This is done by checking your blood sugar (glucose) after you have not eaten for a while (fasting). You may have this done every 1-3 years. Mammogram. This may be done every 1-2 years. Talk with your health care provider about when you should start having regular mammograms. This may depend on whether you have a family history of breast cancer. BRCA-related cancer screening. This may be done if you have a family history of breast,  ovarian, tubal, or peritoneal cancers. Pelvic exam and Pap test. This may be done every 3 years starting at age 80. Starting at age 63, this may be done every 5 years if you have a Pap test in combination with an HPV test. Other tests STD (sexually transmitted disease) testing, if you are at risk. Bone density scan. This is done to screen for osteoporosis. You may have this scan if you are at high risk for osteoporosis. Talk with your health care provider about your test results, treatment options, and if necessary, the need for more tests. Follow these instructions at home: Eating and drinking  Eat a diet that includes fresh fruits and vegetables, whole grains, lean protein, and low-fat dairy products. Take vitamin and mineral supplements as recommended by your health care provider. Do not drink alcohol if: Your health care provider tells you not to drink. You are pregnant, may be pregnant, or are planning to become pregnant. If you drink alcohol: Limit how much you have to 0-1 drink a day. Be aware of how much alcohol is in your drink. In the [place] one drink equals one 12 oz bottle of beer (355 mL), one 5 oz glass of wine (148 mL), or one 1 oz glass of hard liquor (44 mL). Lifestyle Take daily care of your teeth and gums. Brush your teeth every morning and night with fluoride toothpaste. Floss one time each day. Stay active. Exercise  for at least 30 minutes 5 or more days each week. Do not use any products that contain nicotine or tobacco, such as cigarettes, e-cigarettes, and chewing tobacco. If you need help quitting, ask your health care provider. Do not use drugs. If you are sexually active, practice safe sex. Use a condom or other form of protection to prevent STIs (sexually transmitted infections). If you do not wish to become pregnant, use a form of birth control. If you plan to become pregnant, see your health care provider for a prepregnancy visit. If told by your health care  provider, take low-dose aspirin daily starting at age 76. Find healthy ways to cope with stress, such as: Meditation, yoga, or listening to music. Journaling. Talking to a trusted person. Spending time with friends and family. Safety Always wear your seat belt while driving or riding in a vehicle. Do not drive: If you have been drinking alcohol. Do not ride with someone who has been drinking. When you are tired or distracted. While texting. Wear a helmet and other protective equipment during sports activities. If you have firearms in your house, make sure you follow all gun safety procedures. What's next? Visit your health care provider once a year for an annual wellness visit. Ask your health care provider how often you should have your eyes and teeth checked. Stay up to date on all vaccines. This information is not intended to replace advice given to you by your health care provider. Make sure you discuss any questions you have with your health care provider. Document Revised: 11/05/2020 Document Reviewed: 05/09/2018 Elsevier Patient Education  2022 [employer].

## 2021-06-27 LAB — CYTOLOGY - PAP
Comment: NEGATIVE
Diagnosis: NEGATIVE
Diagnosis: REACTIVE
High risk HPV: NEGATIVE

## 2021-08-29 ENCOUNTER — Other Ambulatory Visit (HOSPITAL_COMMUNITY): Payer: Self-pay

## 2021-11-30 ENCOUNTER — Other Ambulatory Visit (HOSPITAL_COMMUNITY): Payer: Self-pay

## 2021-12-20 ENCOUNTER — Encounter: Payer: Self-pay | Admitting: Nurse Practitioner

## 2021-12-20 ENCOUNTER — Ambulatory Visit (INDEPENDENT_AMBULATORY_CARE_PROVIDER_SITE_OTHER): Payer: 59

## 2021-12-20 ENCOUNTER — Ambulatory Visit: Payer: 59 | Admitting: Nurse Practitioner

## 2021-12-20 ENCOUNTER — Ambulatory Visit: Payer: 59 | Admitting: Family Medicine

## 2021-12-20 VITALS — BP 109/64 | HR 79 | Temp 98.7°F | Ht 64.0 in | Wt 116.0 lb

## 2021-12-20 DIAGNOSIS — M25511 Pain in right shoulder: Secondary | ICD-10-CM | POA: Diagnosis not present

## 2021-12-20 MED ORDER — METHYLPREDNISOLONE ACETATE 40 MG/ML IJ SUSP
40.0000 mg | Freq: Once | INTRAMUSCULAR | Status: AC
  Administered 2021-12-20: 40 mg via INTRAMUSCULAR

## 2021-12-20 MED ORDER — PREDNISONE 10 MG (21) PO TBPK: ORAL_TABLET | ORAL | 0 refills | Status: DC

## 2021-12-20 MED ORDER — METHOCARBAMOL 500 MG PO TABS: 500.0000 mg | ORAL_TABLET | Freq: Four times a day (QID) | ORAL | 0 refills | Status: DC

## 2021-12-20 NOTE — Progress Notes (Signed)
? ?Acute Office Visit ? ?Subjective:  ? ? Patient ID: Joyce Hopkins, female    DOB: 1961/09/10, 61 y.o.   MRN: [id number] ? ?Chief Complaint  ?Patient presents with  ? Shoulder Pain  ? ? ?Shoulder Pain  ?The pain is present in the right shoulder. This is a new problem. The current episode started 1 to 4 weeks ago. There has been no history of extremity trauma. The problem occurs intermittently. The problem has been unchanged. The quality of the pain is described as aching. The pain is at a severity of 8/10. The pain is severe. Associated symptoms include a limited range of motion. Pertinent negatives include no joint swelling. The symptoms are aggravated by activity. She has tried NSAIDS for the symptoms. The treatment provided mild relief. Family history does not include gout. There is no history of osteoarthritis.  ? ? ?Past Medical History:  ?Diagnosis Date  ? Allergy   ? Biliary colic   ? GERD (gastroesophageal reflux disease)   ? ? ?Past Surgical History:  ?Procedure Laterality Date  ? CHOLECYSTECTOMY N/A 07/19/2017  ? Procedure: LAPAROSCOPIC CHOLECYSTECTOMY WITH INTRAOPERATIVE CHOLANGIOGRAM;  Surgeon: Stark Klein, MD;  Location: [facility];  Service: General;  Laterality: N/A;  ? TMJ ARTHROPLASTY  1995  ? ? ?Family History  ?Problem Relation Age of Onset  ? Heart disease Father   ? Stroke Neg Hx   ? Migraines Neg Hx   ? Neuropathy Neg Hx   ? Colon cancer Neg Hx   ? Pancreatic cancer Neg Hx   ? Celiac disease Neg Hx   ? Ulcers Neg Hx   ? Inflammatory bowel disease Neg Hx   ? Colon polyps Neg Hx   ? ? ?Social History  ? ?Socioeconomic History  ? Marital status: Married  ?  Spouse name: Lake Bells "wes"  ? Number of children: 2  ? Years of education: 36  ? Highest education level: Not on file  ?Occupational History  ? Occupation: part-time  ?Tobacco Use  ? Smoking status: Never  ? Smokeless tobacco: Never  ?Vaping Use  ? Vaping Use: Never used  ?Substance and Sexual Activity  ? Alcohol use: Yes  ?  Alcohol/week: 0.0 standard  drinks  ?  Comment: a few times a week wine with supper  ? Drug use: No  ? Sexual activity: Not on file  ?Other Topics Concern  ? Not on file  ?Social History Narrative  ? Lives with husband  ? Caffeine use: 1 code per day  ? Coffee daily (very little)  ? ?Social Determinants of Health  ? ?Financial Resource Strain: Not on file  ?Food Insecurity: Not on file  ?Transportation Needs: Not on file  ?Physical Activity: Not on file  ?Stress: Not on file  ?Social Connections: Not on file  ?Intimate Partner Violence: Not on file  ? ? ?Outpatient Medications Prior to Visit  ?Medication Sig Dispense Refill  ? albuterol (PROAIR HFA) 108 (90 Base) MCG/ACT inhaler Inhale 2 puffs into the lungs every 6 (six) hours as needed for wheezing or shortness of breath. 1 each 0  ? ALPRAZolam (XANAX) 0.5 MG tablet Take 1 tablet (0.5 mg total) by mouth at bedtime as needed for anxiety (travel). 30 tablet 1  ? cetirizine (ZYRTEC) 10 MG tablet Take 10 mg by mouth daily.    ? citalopram (CELEXA) 20 MG tablet TAKE 1 TABLET BY MOUTH DAILY 90 tablet 3  ? hyoscyamine (LEVSIN SL) 0.125 MG SL tablet Place 1 tablet (0.125 mg  total) under the tongue every 4 (four) hours as needed. For cramping. Stop if constipation 30 tablet 0  ? ibuprofen (ADVIL,MOTRIN) 200 MG tablet Take 400-600 mg by mouth daily as needed for headache or moderate pain.     ? ?No facility-administered medications prior to visit.  ? ? ?Allergies  ?Allergen Reactions  ? Acetaminophen Anaphylaxis and Other (See Comments)  ?  Wheezing/hives  ? Levofloxacin Other (See Comments)  ?  Muscle spasms  ? ? ?Review of Systems  ?Constitutional: Negative.   ?HENT: Negative.    ?Eyes: Negative.   ?Respiratory: Negative.    ?Gastrointestinal: Negative.   ?Musculoskeletal:  Positive for myalgias.  ?Skin: Negative.  Negative for rash.  ?Neurological: Negative.   ?All other systems reviewed and are negative. ? ?   ?Objective:  ?  ?Physical Exam ?Vitals and nursing note reviewed.  ?Constitutional:   ?    Appearance: Normal appearance.  ?HENT:  ?   Head: Normocephalic.  ?   Right Ear: External ear normal.  ?   Left Ear: External ear normal.  ?   Nose: Nose normal.  ?   Mouth/Throat:  ?   Mouth: Mucous membranes are moist.  ?Eyes:  ?   Conjunctiva/sclera: Conjunctivae normal.  ?Cardiovascular:  ?   Rate and Rhythm: Normal rate and regular rhythm.  ?Abdominal:  ?   General: Bowel sounds are normal.  ?Musculoskeletal:  ?   Right shoulder: Tenderness present. No swelling or crepitus. Decreased range of motion. Normal strength. Normal pulse.  ?Neurological:  ?   General: No focal deficit present.  ?   Mental Status: She is alert and oriented to person, place, and time.  ?Psychiatric:     ?   Behavior: Behavior normal.  ? ? ?BP 109/64   Pulse 79   Temp 98.7 ?F (37.1 ?C)   Ht '5\' 4"'  (1.626 m)   Wt 116 lb (52.6 kg)   SpO2 94%   BMI 19.91 kg/m?  ?Wt Readings from Last 3 Encounters:  ?12/20/21 116 lb (52.6 kg)  ?06/23/21 116 lb 9.6 oz (52.9 kg)  ?05/11/21 114 lb (51.7 kg)  ? ? ?Health Maintenance Due  ?Topic Date Due  ? COVID-19 Vaccine (3 - Moderna risk series) 02/04/2020  ? COLONOSCOPY (Pts 45-40yr Insurance coverage will need to be confirmed)  04/03/2021  ? ? ?There are no preventive care reminders to display for this patient. ? ? ?Lab Results  ?Component Value Date  ? TSH 1.580 05/11/2021  ? ?Lab Results  ?Component Value Date  ? WBC 6.3 05/11/2021  ? HGB 12.1 05/11/2021  ? HCT 36.3 05/11/2021  ? MCV 88 05/11/2021  ? PLT 280 05/11/2021  ? ?Lab Results  ?Component Value Date  ? NA 141 05/11/2021  ? K 4.0 05/11/2021  ? CO2 25 05/11/2021  ? GLUCOSE 78 05/11/2021  ? BUN 12 05/11/2021  ? CREATININE 0.64 05/11/2021  ? BILITOT 0.2 05/11/2021  ? ALKPHOS 71 05/11/2021  ? AST 15 05/11/2021  ? ALT 10 05/11/2021  ? PROT 6.6 05/11/2021  ? ALBUMIN 4.2 05/11/2021  ? CALCIUM 9.3 05/11/2021  ? ANIONGAP 12 06/17/2019  ? EGFR 101 05/11/2021  ? ?Lab Results  ?Component Value Date  ? CHOL 192 05/11/2021  ? ?Lab Results  ?Component Value  Date  ? HDL 47 05/11/2021  ? ?Lab Results  ?Component Value Date  ? LDLCALC 119 (H) 05/11/2021  ? ?Lab Results  ?Component Value Date  ? TRIG 148 05/11/2021  ? ?Lab  Results  ?Component Value Date  ? CHOLHDL 4.1 05/11/2021  ? ?No results found for: HGBA1C ? ?   ?Assessment & Plan:  ?Uncontrolled right shoulder pain. ?-Completed right shoulder x-ray results pending ?-Tylenol/ibuprofen as needed for pain ?-Robaxin for musculoskeletal pain/muscle spasm ?-Prednisone taper ?-40 Depo-Medrol shot given in clinic. ? ?Patient is to follow-up with worsening or unresolved symptoms. ?Problem List Items Addressed This Visit   ?None ?Visit Diagnoses   ? ? Acute pain of right shoulder    -  Primary  ? Relevant Medications  ? methocarbamol (ROBAXIN) 500 MG tablet  ? predniSONE (STERAPRED UNI-PAK 21 TAB) 10 MG (21) TBPK tablet  ? Other Relevant Orders  ? DG Shoulder Right  ? ?  ? ? ? ?Meds ordered this encounter  ?Medications  ? methocarbamol (ROBAXIN) 500 MG tablet  ?  Sig: Take 1 tablet (500 mg total) by mouth 4 (four) times daily.  ?  Dispense:  30 tablet  ?  Refill:  0  ?  Order Specific Question:   Supervising Provider  ?  AnswerClaretta Fraise [[id number]]  ? predniSONE (STERAPRED UNI-PAK 21 TAB) 10 MG (21) TBPK tablet  ?  Sig: 6 tablet day 1, 5 tablet day 2, 4 tablet day 3, 3 tablet day 4, 2 tablet day 5, 1 tablet day 6  ?  Dispense:  1 each  ?  Refill:  0  ?  Order Specific Question:   Supervising Provider  ?  AnswerClaretta Fraise [[id number]]  ? ? ? ?Ivy Lynn, NP ? ?

## 2021-12-20 NOTE — Patient Instructions (Signed)
Shoulder Pain ?Many things can cause shoulder pain, including: ?An injury. ?Moving the shoulder in the same way again and again (overuse). ?Joint pain (arthritis). ?Pain can come from: ?Swelling and irritation (inflammation) of any part of the shoulder. ?An injury to the shoulder joint. ?An injury to: ?Tissues that connect muscle to bone (tendons). ?Tissues that connect bones to each other (ligaments). ?Bones. ?Follow these instructions at home: ?Watch for changes in your symptoms. Let your doctor know about them. Follow these instructions to help with your pain. ?If you have a sling: ?Wear the sling as told by your doctor. Remove it only as told by your doctor. ?Loosen the sling if your fingers: ?Tingle. ?Become numb. ?Turn cold and blue. ?Keep the sling clean. ?If the sling is not waterproof: ?Do not let it get wet. ?Take the sling off when you shower or bathe. ?Managing pain, stiffness, and swelling ? ?If told, put ice on the painful area: ?Put ice in a plastic bag. ?Place a towel between your skin and the bag. ?Leave the ice on for 20 minutes, 2-3 times a day. Stop putting ice on if it does not help with the pain. ?Squeeze a soft ball or a foam pad as much as possible. This prevents swelling in the shoulder. It also helps to strengthen the arm. ?General instructions ?Take over-the-counter and prescription medicines only as told by your doctor. ?Keep all follow-up visits as told by your doctor. This is important. ?Contact a doctor if: ?Your pain gets worse. ?Medicine does not help your pain. ?You have new pain in your arm, hand, or fingers. ?Get help right away if: ?Your arm, hand, or fingers: ?Tingle. ?Are numb. ?Are swollen. ?Are painful. ?Turn white or blue. ?Summary ?Shoulder pain can be caused by many things. These include injury, moving the shoulder in the same away again and again, and joint pain. ?Watch for changes in your symptoms. Let your doctor know about them. ?This condition may be treated with a  sling, ice, and pain medicine. ?Contact your doctor if the pain gets worse or you have new pain. Get help right away if your arm, hand, or fingers tingle or get numb, swollen, or painful. ?Keep all follow-up visits as told by your doctor. This is important. ?This information is not intended to replace advice given to you by your health care provider. Make sure you discuss any questions you have with your health care provider. ?Document Revised: 05/13/2021 Document Reviewed: 05/13/2021 ?Elsevier Patient Education ? [place]. ? ?

## 2021-12-20 NOTE — Addendum Note (Signed)
Addended by: Geryl Rankins D on: 12/20/2021 02:55 PM ? ? Modules accepted: Orders ? ?

## 2022-01-11 DIAGNOSIS — M25511 Pain in right shoulder: Secondary | ICD-10-CM | POA: Diagnosis not present

## 2022-01-13 ENCOUNTER — Ambulatory Visit: Payer: 59 | Admitting: Family Medicine

## 2022-01-25 ENCOUNTER — Ambulatory Visit (INDEPENDENT_AMBULATORY_CARE_PROVIDER_SITE_OTHER): Payer: 59

## 2022-01-25 ENCOUNTER — Ambulatory Visit (INDEPENDENT_AMBULATORY_CARE_PROVIDER_SITE_OTHER): Payer: 59 | Admitting: Podiatry

## 2022-01-25 DIAGNOSIS — G5762 Lesion of plantar nerve, left lower limb: Secondary | ICD-10-CM | POA: Diagnosis not present

## 2022-01-25 DIAGNOSIS — R52 Pain, unspecified: Secondary | ICD-10-CM | POA: Diagnosis not present

## 2022-01-25 DIAGNOSIS — D3613 Benign neoplasm of peripheral nerves and autonomic nervous system of lower limb, including hip: Secondary | ICD-10-CM | POA: Diagnosis not present

## 2022-01-25 MED ORDER — BETAMETHASONE SOD PHOS & ACET 6 (3-3) MG/ML IJ SUSP
3.0000 mg | Freq: Once | INTRAMUSCULAR | Status: AC
  Administered 2022-01-25: 3 mg via INTRA_ARTICULAR

## 2022-01-25 NOTE — Progress Notes (Signed)
? ?  HPI: 61 y.o. female presenting today for new complaint of pain and recent flareup of the Morton's neuroma to the left foot.  Patient states that she began noticing pain and tenderness over the past few weeks.  She has had injections in the past which have helped significantly.  Patient states that she has had no pain or tenderness up till a few weeks ago and she was last seen here in the office on 12/11/2019. ? ?Past Medical History:  ?Diagnosis Date  ? Allergy   ? Biliary colic   ? GERD (gastroesophageal reflux disease)   ? ? ?Past Surgical History:  ?Procedure Laterality Date  ? CHOLECYSTECTOMY N/A 07/19/2017  ? Procedure: LAPAROSCOPIC CHOLECYSTECTOMY WITH INTRAOPERATIVE CHOLANGIOGRAM;  Surgeon: Stark Klein, MD;  Location: [facility];  Service: General;  Laterality: N/A;  ? TMJ ARTHROPLASTY  1995  ? ? ?Allergies  ?Allergen Reactions  ? Acetaminophen Anaphylaxis and Other (See Comments)  ?  Wheezing/hives  ? Levofloxacin Other (See Comments)  ?  Muscle spasms  ? ?  ?Physical Exam: ?General: The patient is alert and oriented x3 in no acute distress. ? ?Dermatology: Skin is warm, dry and supple bilateral lower extremities. Negative for open lesions or macerations. ? ?Vascular: Palpable pedal pulses bilaterally. Capillary refill within normal limits.  Negative for any significant edema or erythema ? ?Neurological: Light touch and protective threshold grossly intact ? ?Musculoskeletal Exam: Partial syndactyly noted second and third digits bilateral.  There is some tenderness to palpation to the second intermetatarsal space left foot.  Pain with lateral compression of the metatarsal heads.  Findings consistent with Morton's neuroma ? ?Radiographic Exam:  ?Normal osseous mineralization. Joint spaces preserved. No fracture/dislocation/boney destruction.   ? ?Assessment: ?1.  Morton's neuroma second interspace left ?2.  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 ? ?Plan of Care:  ?1. Patient evaluated. X-Rays  reviewed.  ?2.  Injection of 0.5 cc Celestone Soluspan injected into the second interspace left ?3.  Patient states that injections have helped significantly in the past.  We are going to pursue conservative treatment including 1-4 serial injections which helped alleviate symptoms for a few years in the past ?4.  Continue wearing good supportive shoes and sneakers ?5.  Return to clinic 1 month ? ?  ?  ?Edrick Kins, DPM ?[place] ? ?Dr. Edrick Kins, DPM  ?  ?[address]                                        ?[place], [place] [address]                ?Office [phone]  ?Fax [phone] ? ? ? ? ?

## 2022-02-08 ENCOUNTER — Encounter: Payer: Self-pay | Admitting: Plastic Surgery

## 2022-02-08 ENCOUNTER — Ambulatory Visit (INDEPENDENT_AMBULATORY_CARE_PROVIDER_SITE_OTHER): Payer: Self-pay | Admitting: Plastic Surgery

## 2022-02-08 DIAGNOSIS — Z719 Counseling, unspecified: Secondary | ICD-10-CM | POA: Insufficient documentation

## 2022-02-08 NOTE — Progress Notes (Signed)
Patient ID: Joyce Hopkins, female    DOB: Sep 24, 1960, 61 y.o.   MRN: [id number]   Chief Complaint  Patient presents with   consultation    The patient is a 60 year old female very pleasant here for evaluation of her abdomen.  She is 5 feet 4 inches tall and weighs 117 pounds.  She is not a smoker and not diabetic.  In 2019 she had a cholecystectomy.  She noticed about a 15 pound weight gain after that and it seemed to all collect in her abdomen.  She feels like she eats really healthy and has not been able to get it to go down.  She is interested in liposuction.  She is otherwise in very good health and no other surgeries.  She has no sign of a hernia or diastases.   Review of Systems  Constitutional: Negative.  Negative for activity change.  Eyes: Negative.   Respiratory: Negative.  Negative for chest tightness.   Cardiovascular: Negative.  Negative for leg swelling.  Gastrointestinal: Negative.   Endocrine: Negative.   Genitourinary: Negative.   Musculoskeletal: Negative.   Skin: Negative.    Past Medical History:  Diagnosis Date   Allergy    Biliary colic    GERD (gastroesophageal reflux disease)     Past Surgical History:  Procedure Laterality Date   CHOLECYSTECTOMY N/A 07/19/2017   Procedure: LAPAROSCOPIC CHOLECYSTECTOMY WITH INTRAOPERATIVE CHOLANGIOGRAM;  Surgeon: Stark Klein, MD;  Location: [facility];  Service: General;  Laterality: N/A;   TMJ ARTHROPLASTY  1995      Current Outpatient Medications:    albuterol (PROAIR HFA) 108 (90 Base) MCG/ACT inhaler, Inhale 2 puffs into the lungs every 6 (six) hours as needed for wheezing or shortness of breath., Disp: 1 each, Rfl: 0   ALPRAZolam (XANAX) 0.5 MG tablet, Take 1 tablet (0.5 mg total) by mouth at bedtime as needed for anxiety (travel)., Disp: 30 tablet, Rfl: 1   cetirizine (ZYRTEC) 10 MG tablet, Take 10 mg by mouth daily., Disp: , Rfl:    citalopram (CELEXA) 20 MG tablet, TAKE 1 TABLET BY MOUTH DAILY, Disp: 90 tablet,  Rfl: 3   hyoscyamine (LEVSIN SL) 0.125 MG SL tablet, Place 1 tablet (0.125 mg total) under the tongue every 4 (four) hours as needed. For cramping. Stop if constipation, Disp: 30 tablet, Rfl: 0   methocarbamol (ROBAXIN) 500 MG tablet, Take 1 tablet (500 mg total) by mouth 4 (four) times daily., Disp: 30 tablet, Rfl: 0   predniSONE (STERAPRED UNI-PAK 21 TAB) 10 MG (21) TBPK tablet, 6 tablet day 1, 5 tablet day 2, 4 tablet day 3, 3 tablet day 4, 2 tablet day 5, 1 tablet day 6, Disp: 1 each, Rfl: 0   Objective:   Vitals:   02/08/22 1244  BP: 103/68  Pulse: 67  SpO2: 96%    Physical Exam Vitals reviewed.  Constitutional:      Appearance: Normal appearance.  HENT:     Head: Normocephalic and atraumatic.  Cardiovascular:     Rate and Rhythm: Normal rate.     Pulses: Normal pulses.  Pulmonary:     Effort: Pulmonary effort is normal. No respiratory distress.  Abdominal:     General: There is no distension.     Palpations: Abdomen is soft. There is no mass.     Tenderness: There is no abdominal tenderness. There is no guarding.     Hernia: No hernia is present.  Musculoskeletal:  General: No swelling or deformity.  Skin:    General: Skin is warm.     Capillary Refill: Capillary refill takes less than 2 seconds.     Coloration: Skin is not jaundiced.     Findings: No bruising.  Neurological:     Mental Status: She is alert and oriented to person, place, and time.    Assessment & Plan:  Encounter for counseling  Patient is a good candidate for liposuction of the abdomen.  I talked about that she will need to make some changes in her lifestyle and diet in order to not end up in the same place.  She seems to understand this.  We will send her a quote.  She will also need a spanks for after surgery.  Pictures were obtained of the patient and placed in the chart with the patient's or guardian's permission.   [place], DO

## 2022-02-22 ENCOUNTER — Other Ambulatory Visit (HOSPITAL_COMMUNITY): Payer: Self-pay

## 2022-02-22 ENCOUNTER — Ambulatory Visit (INDEPENDENT_AMBULATORY_CARE_PROVIDER_SITE_OTHER): Payer: 59 | Admitting: Podiatry

## 2022-02-22 DIAGNOSIS — M25511 Pain in right shoulder: Secondary | ICD-10-CM | POA: Diagnosis not present

## 2022-02-22 DIAGNOSIS — G5762 Lesion of plantar nerve, left lower limb: Secondary | ICD-10-CM

## 2022-02-22 MED ORDER — BETAMETHASONE SOD PHOS & ACET 6 (3-3) MG/ML IJ SUSP
3.0000 mg | Freq: Once | INTRAMUSCULAR | Status: AC
  Administered 2022-02-22: 3 mg via INTRA_ARTICULAR

## 2022-02-22 MED ORDER — MELOXICAM 15 MG PO TABS
ORAL_TABLET | ORAL | 0 refills | Status: DC
  Filled 2022-02-22: qty 60, 60d supply, fill #0

## 2022-02-22 NOTE — Progress Notes (Signed)
   HPI: 61 y.o. female presenting today for follow-up evaluation of a Morton's neuroma to the left foot.  Patient states that the last injection she received helped significantly.    She has had injections in the past which have helped significantly back in 2021.  She received a series of about 3-4 injections which seem to permanently alleviate her symptoms up until recently  Past Medical History:  Diagnosis Date   Allergy    Biliary colic    GERD (gastroesophageal reflux disease)     Past Surgical History:  Procedure Laterality Date   CHOLECYSTECTOMY N/A 07/19/2017   Procedure: LAPAROSCOPIC CHOLECYSTECTOMY WITH INTRAOPERATIVE CHOLANGIOGRAM;  Surgeon: Stark Klein, MD;  Location: [facility];  Service: General;  Laterality: N/A;   TMJ ARTHROPLASTY  1995    Allergies  Allergen Reactions   Acetaminophen Anaphylaxis and Other (See Comments)    Wheezing/hives   Levofloxacin Other (See Comments)    Muscle spasms     Physical Exam: General: The patient is alert and oriented x3 in no acute distress.  Dermatology: Skin is warm, dry and supple bilateral lower extremities. Negative for open lesions or macerations.  Vascular: Palpable pedal pulses bilaterally. Capillary refill within normal limits.  Negative for any significant edema or erythema  Neurological: Light touch and protective threshold grossly intact  Musculoskeletal Exam: Partial syndactyly noted second and third digits bilateral.  There continues to be some tenderness to palpation to the second intermetatarsal space left foot.  Pain with lateral compression of the metatarsal heads.  Findings consistent with Morton's neuroma   Assessment: 1.  Morton's neuroma second interspace left 2.  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 Patient evaluated.  2.  Injection of 0.5 cc Celestone Soluspan injected into the second interspace left 3.  Again, injections have helped significantly in the past.  We  are going to pursue conservative treatment including 1-4 serial injections which helped alleviate symptoms for a few years in the past 4.  Continue wearing good supportive shoes and sneakers 5.  Return to clinic 1 month      Edrick Kins, DPM [facility]  Dr. Edrick Kins, DPM    [address], [place] [address]                Office [phone]  Fax [phone]

## 2022-02-24 ENCOUNTER — Other Ambulatory Visit (HOSPITAL_COMMUNITY): Payer: Self-pay

## 2022-02-24 ENCOUNTER — Telehealth: Payer: Self-pay

## 2022-02-24 NOTE — Telephone Encounter (Signed)
Called patient to see if she is ready to schedule surgery. Will send message via chart.

## 2022-02-25 ENCOUNTER — Other Ambulatory Visit (HOSPITAL_COMMUNITY): Payer: Self-pay

## 2022-02-25 ENCOUNTER — Other Ambulatory Visit: Payer: Self-pay | Admitting: Family Medicine

## 2022-02-25 DIAGNOSIS — R0689 Other abnormalities of breathing: Secondary | ICD-10-CM

## 2022-02-27 ENCOUNTER — Other Ambulatory Visit (HOSPITAL_COMMUNITY): Payer: Self-pay

## 2022-02-27 MED ORDER — ALBUTEROL SULFATE HFA 108 (90 BASE) MCG/ACT IN AERS
2.0000 | INHALATION_SPRAY | Freq: Four times a day (QID) | RESPIRATORY_TRACT | 0 refills | Status: DC | PRN
  Filled 2022-02-27: qty 6.7, 25d supply, fill #0

## 2022-03-10 ENCOUNTER — Ambulatory Visit (INDEPENDENT_AMBULATORY_CARE_PROVIDER_SITE_OTHER): Payer: 59 | Admitting: Gastroenterology

## 2022-03-10 ENCOUNTER — Other Ambulatory Visit: Payer: 59

## 2022-03-10 ENCOUNTER — Encounter: Payer: Self-pay | Admitting: Gastroenterology

## 2022-03-10 VITALS — BP 97/61 | HR 83 | Temp 98.3°F | Ht 64.0 in | Wt 114.4 lb

## 2022-03-10 DIAGNOSIS — R7989 Other specified abnormal findings of blood chemistry: Secondary | ICD-10-CM | POA: Diagnosis not present

## 2022-03-10 DIAGNOSIS — A09 Infectious gastroenteritis and colitis, unspecified: Secondary | ICD-10-CM

## 2022-03-10 NOTE — Patient Instructions (Signed)
Please go to [facility] for stool testing and bloodwork. We will be in touch with results as available. You can use hyoscyamine up to four times daily for abdominal cramping/churning/loose stool.

## 2022-03-10 NOTE — Addendum Note (Signed)
Addended by: Orland Jarred on: 03/10/2022 10:58 AM   Modules accepted: Orders

## 2022-03-10 NOTE — Progress Notes (Signed)
GI Office Note    Referring Provider: [name], DO Primary Care Physician:  [name], DO  Primary Gastroenterologist:Charles K. Abbey Chatters, DO   Chief Complaint   Chief Complaint  Patient presents with   Diarrhea    Starts with fowl smelling gas, this morning bm was a little more solid than it has been the last couple of days.     History of Present Illness   Joyce Hopkins is a 61 y.o. female presenting today for follow-up.  Last seen in February 2022.  She has a history of abnormal LFTs in the setting of acute illness/gallbladder sludge.  Concern for beaded appearance of the bile ducts.  Seen at [facility], suspicion that abnormal appearance of bile ducts due to acute illness and sludge. Autoimmune markers were negative.  December 2019 with normal LFTs.  It was recommended to hold off on repeat MRI/MRCP unless new signs or symptoms.  Normal colonoscopy in 2012 at outside facility.  At last office visit she had had diarrhea for 2 weeks after eating out at a restaurant.  GI pathogen panel detected C. difficile toxin A/B but stand-alone C. difficile toxin A+B was negative.  She was treated with 10-day course of vancomycin given her presentation.  Patient states that her diarrhea resolved.  She is returning today with third "episode" of diarrhea.  Over the past year she has done well with stools.  Sunday she started with typical symptoms, initially develops malodorous gas.  Few days later develops abdominal cramping and loose stools.  Last 2 episodes lasted for several weeks at a time.  She does not feel bad.  She does not have a fever.  No ill contacts.  She has been having 3-4 loose stools per day, worsened with meals.  Does not seem to matter what she eats.  Stomach is churning and rumbling all the time.  Woke up at 4 AM this morning for a stool.  No melena or rectal bleeding.  Today her stool had some consistency.  Only new medication is meloxicam which she started on June  14.  No recent antibiotics.  No ill contacts.  She has hyoscyamine on hand for esophageal spasms but rarely uses.  She has not used for abdominal cramping or loose stool.  Her weight is actually up from baseline.  She prefers to weigh around 110 pounds.  She notes that she does have chickens at home.  She washes her hands regularly and thoroughly after handling.  Medications   Current Outpatient Medications  Medication Sig Dispense Refill   albuterol (PROAIR HFA) 108 (90 Base) MCG/ACT inhaler Inhale 2 puffs into the lungs every 6 (six) hours as needed for wheezing or shortness of breath. 6.7 g 0   ALPRAZolam (XANAX) 0.5 MG tablet Take 1 tablet (0.5 mg total) by mouth at bedtime as needed for anxiety (travel). 30 tablet 1   cetirizine (ZYRTEC) 10 MG tablet Take 10 mg by mouth daily.     citalopram (CELEXA) 20 MG tablet TAKE 1 TABLET BY MOUTH DAILY 90 tablet 3   hyoscyamine (LEVSIN SL) 0.125 MG SL tablet Place 1 tablet (0.125 mg total) under the tongue every 4 (four) hours as needed. For cramping. Stop if constipation 30 tablet 0   meloxicam (MOBIC) 15 MG tablet Take 1 tablet by mouth once a day as directed for 10-14 days, then as needed 60 tablet 0   No current facility-administered medications for this visit.    Allergies  Allergies as of 03/10/2022 - Review Complete 03/10/2022  Allergen Reaction Noted   Acetaminophen Anaphylaxis and Other (See Comments) 02/22/2016   Levofloxacin Other (See Comments) 11/02/2010     Review of Systems   General: Negative for anorexia, weight loss, fever, chills, fatigue, weakness. ENT: Negative for hoarseness, difficulty swallowing , nasal congestion. CV: Negative for chest pain, angina, palpitations, dyspnea on exertion, peripheral edema.  Respiratory: Negative for dyspnea at rest, dyspnea on exertion, cough, sputum, wheezing.  GI: See history of present illness. GU:  Negative for dysuria, hematuria, urinary incontinence, urinary frequency, nocturnal  urination.  Endo: Negative for unusual weight change.     Physical Exam   BP 97/61 (BP Location: Left Arm, Patient Position: Sitting, Cuff Size: Normal)   Pulse 83   Temp 98.3 F (36.8 C) (Oral)   Ht '5\' 4"'$  (1.626 m)   Wt 114 lb 6.4 oz (51.9 kg)   SpO2 96%   BMI 19.64 kg/m    General: Well-nourished, well-developed in no acute distress.  Eyes: No icterus. Mouth: Oropharyngeal mucosa moist and pink , no lesions erythema or exudate. Lungs: Clear to auscultation bilaterally.  Heart: Regular rate and rhythm, no murmurs rubs or gallops.  Abdomen: Bowel sounds are normal, nontender, nondistended, no hepatosplenomegaly or masses,  no abdominal bruits or hernia , no rebound or guarding.  Rectal: not performed  Extremities: No lower extremity edema. No clubbing or deformities. Neuro: Alert and oriented x 4   Skin: Warm and dry, no jaundice.   Psych: Alert and cooperative, normal mood and affect.  Labs   Lab Results  Component Value Date   CREATININE 0.64 05/11/2021   BUN 12 05/11/2021   NA 141 05/11/2021   K 4.0 05/11/2021   CL 101 05/11/2021   CO2 25 05/11/2021   Lab Results  Component Value Date   ALT 10 05/11/2021   AST 15 05/11/2021   ALKPHOS 71 05/11/2021   BILITOT 0.2 05/11/2021   Lab Results  Component Value Date   WBC 6.3 05/11/2021   HGB 12.1 05/11/2021   HCT 36.3 05/11/2021   MCV 88 05/11/2021   PLT 280 05/11/2021   Lab Results  Component Value Date   TSH 1.580 05/11/2021    Imaging Studies   No results found.  Assessment   Diarrhea: Episodic diarrhea lasting for weeks at a time, this is her third bout.  Last time in February 2022, GI pathogen panel at that time was positive for C. difficile although stanolone test was negative.  She was treated with 10-day course of vancomycin and improved.  Has done well until last weekend.  As with other episodes she develops malodorous flatulence, few days later develops abdominal cramping and loose stools.   Symptoms worse postprandially.  She has chickens at home.  Also with well water.  No ill contacts or recent antibiotics.  She has started meloxicam 2 weeks ago.  Differential diagnosis includes infectious etiology such as Campylobacter, C. difficile, less likely Salmonella given presentation.  Cannot exclude Giardia.  Also in the differential includes small bowel bacterial overgrowth, pancreatic insufficiency, celiac, microscopic colitis.  History of abnormal LFTs: Given current GI symptoms, prior history, update labs at this time.  PLAN   Stool for GI pathogen panel. Labs for celiac screening, CMET, CBC. Use levsin up to four times daily as needed for loose stools, cramping. Hold for constipation.  If work up negative, would move towards colonoscopy with random colon biopsies.    Laureen Ochs. Bobby Rumpf,  MHS, PA-C [facility]

## 2022-03-13 ENCOUNTER — Other Ambulatory Visit: Payer: 59

## 2022-03-13 LAB — COMPREHENSIVE METABOLIC PANEL
ALT: 15 IU/L (ref 0–32)
AST: 18 IU/L (ref 0–40)
Albumin/Globulin Ratio: 1.6 (ref 1.2–2.2)
Albumin: 4 g/dL (ref 3.8–4.8)
Alkaline Phosphatase: 83 IU/L (ref 44–121)
BUN/Creatinine Ratio: 21 (ref 12–28)
BUN: 14 mg/dL (ref 8–27)
Bilirubin Total: 0.2 mg/dL (ref 0.0–1.2)
CO2: 25 mmol/L (ref 20–29)
Calcium: 9.2 mg/dL (ref 8.7–10.3)
Chloride: 102 mmol/L (ref 96–106)
Creatinine, Ser: 0.66 mg/dL (ref 0.57–1.00)
Globulin, Total: 2.5 g/dL (ref 1.5–4.5)
Glucose: 93 mg/dL (ref 70–99)
Potassium: 4.1 mmol/L (ref 3.5–5.2)
Sodium: 139 mmol/L (ref 134–144)
Total Protein: 6.5 g/dL (ref 6.0–8.5)
eGFR: 100 mL/min/{1.73_m2} (ref >59)

## 2022-03-13 LAB — CBC WITH DIFFERENTIAL/PLATELET
Basophils Absolute: 0.1 10*3/uL (ref 0.0–0.2)
Basos: 1 %
EOS (ABSOLUTE): 0.2 10*3/uL (ref 0.0–0.4)
Eos: 3 %
Hematocrit: 37.6 % (ref 34.0–46.6)
Hemoglobin: 12.1 g/dL (ref 11.1–15.9)
Immature Grans (Abs): 0 10*3/uL (ref 0.0–0.1)
Immature Granulocytes: 0 %
Lymphocytes Absolute: 1.7 10*3/uL (ref 0.7–3.1)
Lymphs: 25 %
MCH: 28.5 pg (ref 26.6–33.0)
MCHC: 32.2 g/dL (ref 31.5–35.7)
MCV: 89 fL (ref 79–97)
Monocytes Absolute: 0.8 10*3/uL (ref 0.1–0.9)
Monocytes: 12 %
Neutrophils Absolute: 3.9 10*3/uL (ref 1.4–7.0)
Neutrophils: 59 %
Platelets: 249 10*3/uL (ref 150–450)
RBC: 4.25 x10E6/uL (ref 3.77–5.28)
RDW: 12.4 % (ref 11.7–15.4)
WBC: 6.6 10*3/uL (ref 3.4–10.8)

## 2022-03-13 LAB — TISSUE TRANSGLUTAMINASE, IGA: Transglutaminase IgA: <2 U/mL (ref 0–3)

## 2022-03-13 LAB — IGA: IgA/Immunoglobulin A, Serum: 82 mg/dL — ABNORMAL LOW (ref 87–352)

## 2022-03-14 DIAGNOSIS — R197 Diarrhea, unspecified: Secondary | ICD-10-CM

## 2022-03-14 DIAGNOSIS — R509 Fever, unspecified: Secondary | ICD-10-CM

## 2022-03-14 DIAGNOSIS — R109 Unspecified abdominal pain: Secondary | ICD-10-CM

## 2022-03-14 LAB — GI PROFILE, STOOL, PCR

## 2022-03-15 ENCOUNTER — Other Ambulatory Visit: Payer: Self-pay | Admitting: Gastroenterology

## 2022-03-15 ENCOUNTER — Ambulatory Visit (HOSPITAL_COMMUNITY)
Admission: RE | Admit: 2022-03-15 | Discharge: 2022-03-15 | Disposition: A | Discharge status: Home / Self Care | Payer: 59 | Source: Ambulatory Visit | Attending: Gastroenterology | Admitting: Gastroenterology

## 2022-03-15 ENCOUNTER — Other Ambulatory Visit: Payer: Self-pay

## 2022-03-15 ENCOUNTER — Other Ambulatory Visit: Payer: Self-pay | Admitting: Family Medicine

## 2022-03-15 DIAGNOSIS — R7989 Other specified abnormal findings of blood chemistry: Secondary | ICD-10-CM

## 2022-03-15 DIAGNOSIS — F418 Other specified anxiety disorders: Secondary | ICD-10-CM

## 2022-03-15 DIAGNOSIS — R109 Unspecified abdominal pain: Secondary | ICD-10-CM | POA: Insufficient documentation

## 2022-03-15 DIAGNOSIS — R197 Diarrhea, unspecified: Secondary | ICD-10-CM

## 2022-03-15 DIAGNOSIS — R509 Fever, unspecified: Secondary | ICD-10-CM | POA: Insufficient documentation

## 2022-03-15 DIAGNOSIS — A09 Infectious gastroenteritis and colitis, unspecified: Secondary | ICD-10-CM

## 2022-03-15 MED ORDER — IOHEXOL 300 MG/ML  SOLN
100.0000 mL | Freq: Once | INTRAMUSCULAR | Status: AC | PRN
  Administered 2022-03-15: 80 mL via INTRAVENOUS

## 2022-03-15 MED ORDER — AZITHROMYCIN 500 MG PO TABS: 500.0000 mg | ORAL_TABLET | Freq: Every day | ORAL | 0 refills | Status: AC

## 2022-03-15 MED ORDER — IOHEXOL 9 MG/ML PO SOLN
ORAL | Status: AC
  Filled 2022-03-15: qty 1000

## 2022-03-15 NOTE — Telephone Encounter (Signed)
Communication noted.  

## 2022-03-15 NOTE — Telephone Encounter (Signed)
Called pt. Aware to go ahead and head over to CT now. She voiced understanding

## 2022-03-15 NOTE — Addendum Note (Signed)
Addended by: Cheron Every on: 03/15/2022 03:09 PM   Modules accepted: Orders

## 2022-03-15 NOTE — Telephone Encounter (Signed)
Please arrange for asap CT A/P with contrast Dx: fever, abd pain, bloody diarrhea.

## 2022-03-17 ENCOUNTER — Ambulatory Visit: Payer: 59 | Admitting: Physician Assistant

## 2022-03-17 ENCOUNTER — Other Ambulatory Visit (HOSPITAL_COMMUNITY): Payer: Self-pay

## 2022-03-17 ENCOUNTER — Encounter: Payer: Self-pay | Admitting: Physician Assistant

## 2022-03-17 VITALS — BP 121/64 | HR 120 | Temp 98.2°F | Ht 64.0 in | Wt 111.2 lb

## 2022-03-17 DIAGNOSIS — K529 Noninfective gastroenteritis and colitis, unspecified: Secondary | ICD-10-CM

## 2022-03-17 NOTE — Patient Instructions (Signed)
Diarrhea, Adult Diarrhea is frequent loose and watery bowel movements. Diarrhea can make you feel weak and cause you to become dehydrated. Dehydration can make you tired and thirsty, cause you to have a dry mouth, and decrease how often you urinate. Diarrhea typically lasts 2-3 days. However, it can last longer if it is a sign of something more serious. It is important to treat your diarrhea as told by your health care provider. Follow these instructions at home: Eating and drinking     Follow these recommendations as told by your health care provider: Take an oral rehydration solution (ORS). This is an over-the-counter medicine that helps return your body to its normal balance of nutrients and water. It is found at pharmacies and retail stores. Drink plenty of fluids, such as water, ice chips, diluted fruit juice, and low-calorie sports drinks. You can drink milk also, if desired. Avoid drinking fluids that contain a lot of sugar or caffeine, such as energy drinks, sports drinks, and soda. Eat bland, easy-to-digest foods in small amounts as you are able. These foods include bananas, applesauce, rice, lean meats, toast, and crackers. Avoid alcohol. Avoid spicy or fatty foods.  Medicines Take over-the-counter and prescription medicines only as told by your health care provider. If you were prescribed an antibiotic medicine, take it as told by your health care provider. Do not stop using the antibiotic even if you start to feel better. General instructions  Wash your hands often using soap and water. If soap and water are not available, use a hand sanitizer. Others in the household should wash their hands as well. Hands should be washed: After using the toilet or changing a diaper. Before preparing, cooking, or serving food. While caring for a sick person or while visiting someone in a hospital. Drink enough fluid to keep your urine pale yellow. Rest at home while you recover. Watch your  condition for any changes. Take a warm bath to relieve any burning or pain from frequent diarrhea episodes. Keep all follow-up visits as told by your health care provider. This is important. Contact a health care provider if: You have a fever. Your diarrhea gets worse. You have new symptoms. You cannot keep fluids down. You feel light-headed or dizzy. You have a headache. You have muscle cramps. Get help right away if: You have chest pain. You feel extremely weak or you faint. You have bloody or black stools or stools that look like tar. You have severe pain, cramping, or bloating in your abdomen. You have trouble breathing or you are breathing very quickly. Your heart is beating very quickly. Your skin feels cold and clammy. You feel confused. You have signs of dehydration, such as: Dark urine, very little urine, or no urine. Cracked lips. Dry mouth. Sunken eyes. Sleepiness. Weakness. Summary Diarrhea is frequent loose and sometimes watery bowel movements. Diarrhea can make you feel weak and cause you to become dehydrated. Drink enough fluids to keep your urine pale yellow. Make sure that you wash your hands after using the toilet. If soap and water are not available, use hand sanitizer. Contact a health care provider if your diarrhea gets worse or you have new symptoms. Get help right away if you have signs of dehydration. This information is not intended to replace advice given to you by your health care provider. Make sure you discuss any questions you have with your health care provider. Document Revised: 03/09/2021 Document Reviewed: 03/09/2021 Elsevier Patient Education  [place].

## 2022-03-17 NOTE — Progress Notes (Signed)
  Subjective:     Patient ID: Joyce Hopkins, female   DOB: 05-06-1961, 61 y.o.   MRN: [id number]  HPI Pt here with complicated hx Prev hx of colitis and followed by her GI in [place] On 6/26 pt noted abd cramping and diarrhea + blood noted in the diarrhea Pt called her GI specialist on 6/30 Due to worsening sx pt then went to ER on 7/5 There CT scan + for colitis and pt started on Zithromax to cover Salm. Risk for C diff thought to low due to no recent ATB use Here today due to continued sx Pt tolerating fluids well   Review of Systems  Constitutional:  Positive for activity change, appetite change, fatigue and fever.  Respiratory: Negative.    Cardiovascular: Negative.   Gastrointestinal:  Positive for blood in stool, diarrhea, nausea and vomiting. Negative for abdominal distention and abdominal pain.       Objective:   Physical Exam Vitals and nursing note reviewed.  Constitutional:      General: She is not in acute distress.    Appearance: Normal appearance. She is ill-appearing. She is not toxic-appearing.  Cardiovascular:     Rate and Rhythm: Normal rate and regular rhythm.  Abdominal:     General: There is no distension.     Palpations: Abdomen is soft. There is no mass.     Tenderness: There is abdominal tenderness. There is no right CVA tenderness, guarding or rebound.     Comments: LUQ TTP  Neurological:     Mental Status: She is alert.   Ortho- 118/68 p105; 108/69 p109; 102/67 p113 Prev labs/CT reviewed      Assessment:     Colitis    Plan:     Fluids - caff/dairy free Continue Zithromax per GI [place] diet Reviewed dehydration sx and pt aware to go to ER if they present She is in contact with GI so follow their recommendations Stressed need for colonoscopy once sx improve  F/U here prn PATP

## 2022-03-18 ENCOUNTER — Ambulatory Visit: Payer: 59 | Admitting: Podiatry

## 2022-03-20 NOTE — Telephone Encounter (Signed)
Please schedule colonoscopy in 6 weeks with Dr. Abbey Chatters Dx: colitis ASA 2

## 2022-03-21 ENCOUNTER — Other Ambulatory Visit (HOSPITAL_COMMUNITY): Payer: Self-pay

## 2022-03-21 MED ORDER — CLENPIQ 10-3.5-12 MG-GM -GM/175ML PO SOLN
1.0000 | ORAL | 0 refills | Status: DC
Start: 2022-03-21 — End: 2022-05-24
  Filled 2022-03-21: qty 350, 1d supply, fill #0

## 2022-03-21 NOTE — Addendum Note (Signed)
Addended by: Cheron Every on: 03/21/2022 10:32 AM   Modules accepted: Orders

## 2022-03-27 ENCOUNTER — Ambulatory Visit: Payer: 59 | Admitting: Podiatry

## 2022-04-05 DIAGNOSIS — M67911 Unspecified disorder of synovium and tendon, right shoulder: Secondary | ICD-10-CM | POA: Diagnosis not present

## 2022-04-06 LAB — SPECIMEN STATUS REPORT

## 2022-04-06 LAB — TISSUE TRANSGLUTAMINASE ABS,IGG,IGA
Tissue Transglut Ab: <2 U/mL (ref 0–5)
Transglutaminase IgA: <2 U/mL (ref 0–3)

## 2022-04-07 ENCOUNTER — Other Ambulatory Visit: Payer: Self-pay | Admitting: Orthopedic Surgery

## 2022-04-07 ENCOUNTER — Other Ambulatory Visit (HOSPITAL_COMMUNITY): Payer: Self-pay | Admitting: Orthopedic Surgery

## 2022-04-07 DIAGNOSIS — M25511 Pain in right shoulder: Secondary | ICD-10-CM

## 2022-04-11 ENCOUNTER — Ambulatory Visit: Payer: 59 | Admitting: Gastroenterology

## 2022-04-18 ENCOUNTER — Telehealth: Payer: Self-pay | Admitting: *Deleted

## 2022-04-18 NOTE — Telephone Encounter (Signed)
Spoke with pt. She needed procedure wed or Friday. She was on for 8/25 but Dr. Abbey Chatters will be out that day. Rescheduled to 9/13 at 7:30am. Aware will send new instructions.

## 2022-04-19 ENCOUNTER — Ambulatory Visit (INDEPENDENT_AMBULATORY_CARE_PROVIDER_SITE_OTHER): Payer: 59 | Admitting: Podiatry

## 2022-04-19 DIAGNOSIS — G5762 Lesion of plantar nerve, left lower limb: Secondary | ICD-10-CM

## 2022-04-19 MED ORDER — BETAMETHASONE SOD PHOS & ACET 6 (3-3) MG/ML IJ SUSP: 3.0000 mg | Freq: Once | INTRAMUSCULAR | Status: DC

## 2022-04-19 NOTE — Progress Notes (Signed)
   Chief Complaint  Patient presents with   Routine Post Op    1 month Neuroma left, Pain varies in intensity, a lot better than before, pressure triggers pain    HPI: 61 y.o. female presenting today for follow-up evaluation of a Morton's neuroma to the left foot.  Patient states that the last few injections have helped significantly.  Most recently she did wear a pair of [employer] running shoes that she believes may have increased the symptoms of the neuroma and she is getting some shooting sensations to the third toe.  Since that time she was changed over to [facility] running shoes and she does feel improvement.  She has had injections in the past which have helped significantly back in 2021.  She received a series of about 3-4 injections which seem to permanently alleviate her symptoms up until recently  Past Medical History:  Diagnosis Date   Allergy    Biliary colic    GERD (gastroesophageal reflux disease)     Past Surgical History:  Procedure Laterality Date   CHOLECYSTECTOMY N/A 07/19/2017   Procedure: LAPAROSCOPIC CHOLECYSTECTOMY WITH INTRAOPERATIVE CHOLANGIOGRAM;  Surgeon: Stark Klein, MD;  Location: [facility];  Service: General;  Laterality: N/A;   TMJ ARTHROPLASTY  1995    Allergies  Allergen Reactions   Acetaminophen Anaphylaxis and Other (See Comments)    Wheezing/hives   Levofloxacin Other (See Comments)    Muscle spasms     Physical Exam: General: The patient is alert and oriented x3 in no acute distress.  Dermatology: Skin is warm, dry and supple bilateral lower extremities. Negative for open lesions or macerations.  Vascular: Palpable pedal pulses bilaterally. Capillary refill within normal limits.  Negative for any significant edema or erythema  Neurological: Light touch and protective threshold grossly intact  Musculoskeletal Exam: Partial syndactyly noted second and third digits bilateral.  There continues to be some tenderness to palpation to the second  intermetatarsal space left foot.  Pain with lateral compression of the metatarsal heads.  Findings consistent with Morton's neuroma   Assessment: 1.  Morton's neuroma second interspace left 2.  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 Patient evaluated.  2.  Injection of 0.5 cc Celestone Soluspan injected into the second interspace left 3.  Continue [employer] running shoes that are wide and do not constrict the toebox area or forefoot 4.  Return to clinic 4 weeks     Edrick Kins, DPM [facility]  Dr. Edrick Kins, DPM    [address], [place] [address]                Office [phone]  Fax [phone]

## 2022-04-26 ENCOUNTER — Ambulatory Visit (HOSPITAL_COMMUNITY)
Admission: RE | Admit: 2022-04-26 | Discharge: 2022-04-26 | Disposition: A | Discharge status: Home / Self Care | Payer: 59 | Source: Ambulatory Visit | Attending: Orthopedic Surgery | Admitting: Orthopedic Surgery

## 2022-04-26 DIAGNOSIS — M25511 Pain in right shoulder: Secondary | ICD-10-CM | POA: Diagnosis not present

## 2022-05-08 DIAGNOSIS — M7501 Adhesive capsulitis of right shoulder: Secondary | ICD-10-CM | POA: Diagnosis not present

## 2022-05-12 ENCOUNTER — Other Ambulatory Visit: Payer: Self-pay | Admitting: Family Medicine

## 2022-05-12 ENCOUNTER — Ambulatory Visit (INDEPENDENT_AMBULATORY_CARE_PROVIDER_SITE_OTHER): Payer: 59 | Admitting: Family Medicine

## 2022-05-12 ENCOUNTER — Other Ambulatory Visit: Payer: Self-pay

## 2022-05-12 ENCOUNTER — Other Ambulatory Visit (HOSPITAL_COMMUNITY): Payer: Self-pay

## 2022-05-12 ENCOUNTER — Encounter: Payer: Self-pay | Admitting: Family Medicine

## 2022-05-12 VITALS — BP 112/68 | HR 67 | Temp 97.3°F | Ht 64.0 in | Wt 109.8 lb

## 2022-05-12 DIAGNOSIS — F418 Other specified anxiety disorders: Secondary | ICD-10-CM

## 2022-05-12 DIAGNOSIS — Z1231 Encounter for screening mammogram for malignant neoplasm of breast: Secondary | ICD-10-CM

## 2022-05-12 DIAGNOSIS — F411 Generalized anxiety disorder: Secondary | ICD-10-CM | POA: Diagnosis not present

## 2022-05-12 DIAGNOSIS — A09 Infectious gastroenteritis and colitis, unspecified: Secondary | ICD-10-CM

## 2022-05-12 DIAGNOSIS — Z79899 Other long term (current) drug therapy: Secondary | ICD-10-CM

## 2022-05-12 MED ORDER — AZITHROMYCIN 500 MG PO TABS: 500.0000 mg | ORAL_TABLET | Freq: Every day | ORAL | 0 refills | Status: AC

## 2022-05-12 MED ORDER — CITALOPRAM HYDROBROMIDE 20 MG PO TABS
ORAL_TABLET | Freq: Every day | ORAL | 3 refills | Status: DC
  Filled 2022-05-12: qty 90, 90d supply, fill #0
  Filled 2022-08-22: qty 90, 90d supply, fill #1
  Filled 2022-11-22: qty 90, 90d supply, fill #2
  Filled 2023-02-27: qty 90, 90d supply, fill #3

## 2022-05-12 MED ORDER — ALPRAZOLAM 0.5 MG PO TABS
0.5000 mg | ORAL_TABLET | Freq: Every evening | ORAL | 1 refills | Status: DC | PRN
  Filled 2022-05-12 – 2022-05-16 (×2): qty 30, 30d supply, fill #0
  Filled 2022-11-06: qty 30, 30d supply, fill #1

## 2022-05-12 NOTE — Progress Notes (Signed)
Subjective: CC: Med refills PCP: [name], DO FFM:[facility] Joyce Hopkins is a 61 y.o. female presenting to clinic today for:  1.  General anxiety disorder with panic attack/ ?diarrheal illness She is compliant with Celexa.  She reports that she is having situational anxiety lately due to the GI issues that she has been experiencing.  She has an upcoming colonoscopy in about 2 weeks to further evaluate this as she has required multiple antibiotics over the last year to treat this.  She reports normal bowel movements currently.  No blood in stool.  No abdominal pain.  No nausea, vomiting.  No major changes in diet.  Denies any excessive daytime sedation, falls or respiratory depression with the alprazolam.  No problems with memory or visual or auditory hallucinations.  Uses these benzodiazepines very sparingly   ROS: Per HPI  Allergies  Allergen Reactions   Acetaminophen Anaphylaxis and Other (See Comments)    Wheezing/hives   Levofloxacin Other (See Comments)    Muscle spasms   Past Medical History:  Diagnosis Date   Allergy    Biliary colic    GERD (gastroesophageal reflux disease)     Current Outpatient Medications:    albuterol (PROAIR HFA) 108 (90 Base) MCG/ACT inhaler, Inhale 2 puffs into the lungs every 6 (six) hours as needed for wheezing or shortness of breath., Disp: 6.7 g, Rfl: 0   azithromycin (ZITHROMAX) 500 MG tablet, Take 1 tablet (500 mg total) by mouth daily for 7 days., Disp: 7 tablet, Rfl: 0   cetirizine (ZYRTEC) 10 MG tablet, Take 10 mg by mouth daily., Disp: , Rfl:    hyoscyamine (LEVSIN SL) 0.125 MG SL tablet, Place 1 tablet (0.125 mg total) under the tongue every 4 (four) hours as needed. For cramping. Stop if constipation, Disp: 30 tablet, Rfl: 0   meloxicam (MOBIC) 15 MG tablet, Take 1 tablet by mouth once a day as directed for 10-14 days, then as needed, Disp: 60 tablet, Rfl: 0   Sod Picosulfate-Mag Ox-Cit Acd (CLENPIQ) 10-3.5-12 MG-GM -GM/175ML SOLN, Take 1  kit by mouth as directed., Disp: 350 mL, Rfl: 0   ALPRAZolam (XANAX) 0.5 MG tablet, Take 1 tablet (0.5 mg total) by mouth at bedtime as needed for anxiety (travel)., Disp: 30 tablet, Rfl: 1   citalopram (CELEXA) 20 MG tablet, TAKE 1 TABLET BY MOUTH DAILY, Disp: 90 tablet, Rfl: 3 Social History   Socioeconomic History   Marital status: Married    Spouse name: Lake Bells "wes"   Number of children: 2   Years of education: 16   Highest education level: Not on file  Occupational History   Occupation: part-time  Tobacco Use   Smoking status: Never   Smokeless tobacco: Never  Vaping Use   Vaping Use: Never used  Substance and Sexual Activity   Alcohol use: Yes    Alcohol/week: 0.0 standard drinks of alcohol    Comment: a few times a week wine with supper   Drug use: No   Sexual activity: Yes  Other Topics Concern   Not on file  Social History Narrative   Lives with husband   Caffeine use: 1 code per day   Coffee daily (very little)   Social Determinants of Health   Financial Resource Strain: Not on file  Food Insecurity: Not on file  Transportation Needs: Not on file  Physical Activity: Not on file  Stress: Not on file  Social Connections: Not on file  Intimate Partner Violence: Not on file  Family History  Problem Relation Age of Onset   Heart disease Father    Stroke Neg Hx    Migraines Neg Hx    Neuropathy Neg Hx    Colon cancer Neg Hx    Pancreatic cancer Neg Hx    Celiac disease Neg Hx    Ulcers Neg Hx    Inflammatory bowel disease Neg Hx    Colon polyps Neg Hx     Objective: Office vital signs reviewed. BP 112/68   Pulse 67   Temp (!) 97.3 F (36.3 C)   Ht 5' 4" (1.626 m)   Wt 109 lb 12.8 oz (49.8 kg)   SpO2 98%   BMI 18.85 kg/m   Physical Examination:  General: Awake, alert, well nourished, No acute distress HEENT: Sclera white.  PERRLA.  EOMI. Cardio: regular rate and rhythm, S1S2 heard, no murmurs appreciated Pulm: clear to auscultation  bilaterally, no wheezes, rhonchi or rales; normal work of breathing on room air GI: soft, non-tender, non-distended, bowel sounds present x4, no hepatomegaly, no splenomegaly, no masses Psych: Mood stable, speech normal, affect appropriate.  Very pleasant, interactive.     03/17/2022   10:28 AM 12/20/2021   12:19 PM 06/23/2021   11:04 AM 05/11/2021   10:09 AM 06/08/2020    3:33 PM  Depression screen PHQ 2/9  Decreased Interest 0 0 0 0 0  Down, Depressed, Hopeless 0 0 0 0 0  PHQ - 2 Score 0 0 0 0 0  Altered sleeping  0 0 0 0  Tired, decreased energy  0 0 0 0  Change in appetite  0 0 0 0  Feeling bad or failure about yourself   0 0 0 0  Trouble concentrating  0 0 0 0  Moving slowly or fidgety/restless  0 0 0 0  Suicidal thoughts  0 0 0 0  PHQ-9 Score  0 0 0 0  Difficult doing work/chores  Not difficult at all Not difficult at all Somewhat difficult       03/17/2022   10:28 AM 12/20/2021   12:20 PM 06/23/2021   11:04 AM 05/11/2021   10:10 AM  GAD 7 : Generalized Anxiety Score  Nervous, Anxious, on Edge 0 0 0 1  Control/stop worrying 0 0 0 0  Worry too much - different things 0 0 0 0  Trouble relaxing 0 0 0 0  Restless 0 0 0 0  Easily annoyed or irritable 0 0 0   Afraid - awful might happen 0 0 0 0  Total GAD 7 Score 0 0 0   Anxiety Difficulty Not difficult at all Not difficult at all Not difficult at all Not difficult at all    Assessment/ Plan: 61 y.o. female   Generalized anxiety disorder - Plan: ToxASSURE Select 13 (MW), Urine, citalopram (CELEXA) 20 MG tablet  Situational anxiety - Plan: ToxASSURE Select 13 (MW), Urine, ALPRAZolam (XANAX) 0.5 MG tablet  Controlled substance agreement signed - Plan: ToxASSURE Select 13 (MW), Urine  Infectious diarrhea - Plan: azithromycin (ZITHROMAX) 500 MG tablet  Anxiety disorder slightly exacerbated with recent GI issues.  Continue Celexa 20 mg daily.  Alprazolam as needed.  UDS and CSC were updated as per office policy.  National  narcotic database reviewed and there were no red flags.  She continues to use this medication sparingly and appropriately  I have given her a pocket prescription for azithromycin, which was the last prescription that was prescribed by her gastroenterologist for these  GI issues as she is going on a trip and this is causing her to be anxious that she may have recurrence of symptoms and not have access to the medications which have been successfully treated in the past  Orders Placed This Encounter  Procedures   ToxASSURE Select 13 (MW), Urine    Current Outpatient Medications:    albuterol (PROAIR HFA) 108 (90 Base) MCG/ACT inhaler, Inhale 2 puffs into the lungs every 6 (six) hours as needed for wheezing or shortness of breath., Disp: 6.7 g, Rfl: 0   ALPRAZolam (XANAX) 0.5 MG tablet, Take 1 tablet (0.5 mg total) by mouth at bedtime as needed for anxiety (travel)., Disp: 30 tablet, Rfl: 1   cetirizine (ZYRTEC) 10 MG tablet, Take 10 mg by mouth daily., Disp: , Rfl:    citalopram (CELEXA) 20 MG tablet, TAKE 1 TABLET BY MOUTH DAILY, Disp: 90 tablet, Rfl: 3   hyoscyamine (LEVSIN SL) 0.125 MG SL tablet, Place 1 tablet (0.125 mg total) under the tongue every 4 (four) hours as needed. For cramping. Stop if constipation, Disp: 30 tablet, Rfl: 0   meloxicam (MOBIC) 15 MG tablet, Take 1 tablet by mouth once a day as directed for 10-14 days, then as needed, Disp: 60 tablet, Rfl: 0   Sod Picosulfate-Mag Ox-Cit Acd (CLENPIQ) 10-3.5-12 MG-GM -GM/175ML SOLN, Take 1 kit by mouth as directed., Disp: 350 mL, Rfl: 0   Meds ordered this encounter  Medications   ALPRAZolam (XANAX) 0.5 MG tablet    Sig: Take 1 tablet (0.5 mg total) by mouth at bedtime as needed for anxiety (travel).    Dispense:  30 tablet    Refill:  1   citalopram (CELEXA) 20 MG tablet    Sig: TAKE 1 TABLET BY MOUTH DAILY    Dispense:  90 tablet    Refill:  3   azithromycin (ZITHROMAX) 500 MG tablet    Sig: Take 1 tablet (500 mg total) by  mouth daily for 7 days.    Dispense:  7 tablet    Refill:  0    We will see this patient back in October for her annual physical with fasting labs [place], [place] [phone]

## 2022-05-16 ENCOUNTER — Other Ambulatory Visit (HOSPITAL_COMMUNITY): Payer: Self-pay

## 2022-05-17 ENCOUNTER — Ambulatory Visit
Admission: RE | Admit: 2022-05-17 | Discharge: 2022-05-17 | Disposition: A | Discharge status: Home / Self Care | Payer: 59 | Source: Ambulatory Visit | Attending: Family Medicine | Admitting: Family Medicine

## 2022-05-17 ENCOUNTER — Ambulatory Visit (INDEPENDENT_AMBULATORY_CARE_PROVIDER_SITE_OTHER): Payer: 59 | Admitting: Podiatry

## 2022-05-17 DIAGNOSIS — Z1231 Encounter for screening mammogram for malignant neoplasm of breast: Secondary | ICD-10-CM | POA: Diagnosis not present

## 2022-05-17 DIAGNOSIS — G5762 Lesion of plantar nerve, left lower limb: Secondary | ICD-10-CM | POA: Diagnosis not present

## 2022-05-17 MED ORDER — BETAMETHASONE SOD PHOS & ACET 6 (3-3) MG/ML IJ SUSP
3.0000 mg | Freq: Once | INTRAMUSCULAR | Status: AC
  Administered 2022-05-17: 3 mg via INTRA_ARTICULAR

## 2022-05-17 NOTE — Progress Notes (Signed)
   Chief Complaint  Patient presents with   Neuroma    Left foot morton's neuroma, pt denies any pain, pt states she still has a knot on the bottom of the foot     HPI: 61 y.o. female presenting today for follow-up evaluation of a Morton's neuroma to the left foot.  Overall the patient states that she is significantly better.  She does still feel some slight shooting sensations.  Overall improvement however she presents for further treatment and evaluation  Past Medical History:  Diagnosis Date   Allergy    Biliary colic    GERD (gastroesophageal reflux disease)     Past Surgical History:  Procedure Laterality Date   CHOLECYSTECTOMY N/A 07/19/2017   Procedure: LAPAROSCOPIC CHOLECYSTECTOMY WITH INTRAOPERATIVE CHOLANGIOGRAM;  Surgeon: Stark Klein, MD;  Location: [facility];  Service: General;  Laterality: N/A;   TMJ ARTHROPLASTY  1995    Allergies  Allergen Reactions   Tylenol [Acetaminophen] Anaphylaxis and Other (See Comments)    Wheezing/hives   Levofloxacin Other (See Comments)    Muscle spasms     Physical Exam: General: The patient is alert and oriented x3 in no acute distress.  Dermatology: Skin is warm, dry and supple bilateral lower extremities. Negative for open lesions or macerations.  Vascular: Palpable pedal pulses bilaterally. Capillary refill within normal limits.  Negative for any significant edema or erythema  Neurological: Light touch and protective threshold grossly intact  Musculoskeletal Exam: Partial syndactyly noted second and third digits bilateral.  There continues to be some tenderness to palpation to the second intermetatarsal space left foot.  Pain with lateral compression of the metatarsal heads.  Findings consistent with Morton's neuroma   Assessment: 1.  Morton's neuroma second interspace left 2.  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 Patient evaluated.  2.  Injection of 0.5 cc Celestone Soluspan injected  into the second interspace left 3.  Continue [place] running shoes that are wide and do not constrict the toebox area or forefoot 4.  Return to clinic as needed     Edrick Kins, DPM [facility]  Dr. Edrick Kins, DPM    [address], [place] [address]                Office [phone]  Fax [phone]

## 2022-05-22 ENCOUNTER — Encounter (HOSPITAL_COMMUNITY)
Admission: RE | Admit: 2022-05-22 | Discharge: 2022-05-22 | Disposition: A | Discharge status: Home / Self Care | Payer: 59 | Source: Ambulatory Visit | Attending: Internal Medicine | Admitting: Internal Medicine

## 2022-05-24 ENCOUNTER — Other Ambulatory Visit: Payer: Self-pay

## 2022-05-24 ENCOUNTER — Ambulatory Visit: Payer: 59 | Admitting: Physical Therapy

## 2022-05-24 ENCOUNTER — Encounter (HOSPITAL_COMMUNITY)
Admission: RE | Disposition: A | Discharge status: Home / Self Care | Payer: Self-pay | Source: Ambulatory Visit | Attending: Internal Medicine

## 2022-05-24 ENCOUNTER — Encounter (HOSPITAL_COMMUNITY): Payer: Self-pay

## 2022-05-24 ENCOUNTER — Ambulatory Visit (HOSPITAL_COMMUNITY): Payer: 59 | Admitting: Anesthesiology

## 2022-05-24 ENCOUNTER — Ambulatory Visit (HOSPITAL_BASED_OUTPATIENT_CLINIC_OR_DEPARTMENT_OTHER): Payer: 59 | Admitting: Anesthesiology

## 2022-05-24 ENCOUNTER — Ambulatory Visit (HOSPITAL_COMMUNITY)
Admission: RE | Admit: 2022-05-24 | Discharge: 2022-05-24 | Disposition: A | Discharge status: Home / Self Care | Payer: 59 | Source: Ambulatory Visit | Attending: Internal Medicine | Admitting: Internal Medicine

## 2022-05-24 DIAGNOSIS — Z1212 Encounter for screening for malignant neoplasm of rectum: Secondary | ICD-10-CM | POA: Diagnosis not present

## 2022-05-24 DIAGNOSIS — K648 Other hemorrhoids: Secondary | ICD-10-CM | POA: Insufficient documentation

## 2022-05-24 DIAGNOSIS — D123 Benign neoplasm of transverse colon: Secondary | ICD-10-CM | POA: Diagnosis not present

## 2022-05-24 DIAGNOSIS — K635 Polyp of colon: Secondary | ICD-10-CM | POA: Diagnosis not present

## 2022-05-24 DIAGNOSIS — Z1211 Encounter for screening for malignant neoplasm of colon: Secondary | ICD-10-CM | POA: Diagnosis not present

## 2022-05-24 DIAGNOSIS — K630 Abscess of intestine: Secondary | ICD-10-CM | POA: Diagnosis not present

## 2022-05-24 DIAGNOSIS — J45909 Unspecified asthma, uncomplicated: Secondary | ICD-10-CM | POA: Diagnosis not present

## 2022-05-24 DIAGNOSIS — K529 Noninfective gastroenteritis and colitis, unspecified: Secondary | ICD-10-CM | POA: Diagnosis not present

## 2022-05-24 DIAGNOSIS — K5140 Inflammatory polyps of colon without complications: Secondary | ICD-10-CM | POA: Diagnosis not present

## 2022-05-24 DIAGNOSIS — K219 Gastro-esophageal reflux disease without esophagitis: Secondary | ICD-10-CM | POA: Insufficient documentation

## 2022-05-24 DIAGNOSIS — F419 Anxiety disorder, unspecified: Secondary | ICD-10-CM | POA: Insufficient documentation

## 2022-05-24 DIAGNOSIS — D122 Benign neoplasm of ascending colon: Secondary | ICD-10-CM

## 2022-05-24 DIAGNOSIS — K6389 Other specified diseases of intestine: Secondary | ICD-10-CM | POA: Diagnosis not present

## 2022-05-24 DIAGNOSIS — Z139 Encounter for screening, unspecified: Secondary | ICD-10-CM | POA: Diagnosis not present

## 2022-05-24 HISTORY — PX: BIOPSY: SHX5522

## 2022-05-24 HISTORY — PX: COLONOSCOPY WITH PROPOFOL: SHX5780

## 2022-05-24 HISTORY — PX: POLYPECTOMY: SHX5525

## 2022-05-24 SURGERY — COLONOSCOPY WITH PROPOFOL
Anesthesia: General

## 2022-05-24 MED ORDER — PROPOFOL 10 MG/ML IV BOLUS
INTRAVENOUS | Status: DC | PRN
  Administered 2022-05-24: 60 mg via INTRAVENOUS

## 2022-05-24 MED ORDER — PROPOFOL 500 MG/50ML IV EMUL
INTRAVENOUS | Status: AC
  Filled 2022-05-24: qty 50

## 2022-05-24 MED ORDER — PROPOFOL 500 MG/50ML IV EMUL
INTRAVENOUS | Status: DC | PRN
  Administered 2022-05-24: 150 ug/kg/min via INTRAVENOUS

## 2022-05-24 MED ORDER — LACTATED RINGERS IV SOLN: INTRAVENOUS | Status: DC

## 2022-05-24 NOTE — Op Note (Signed)
[facility] Patient Name: Joyce Hopkins Procedure Date: 05/24/2022 7:13 AM MRN: [id number] Date of Birth: 01-Jun-1961 Attending MD: Elon Alas. Edgar Frisk CSN: [id number] Age: 61 Admit Type: Outpatient Procedure:                Colonoscopy Indications:              Screening for colorectal malignant neoplasm Providers:                Elon Alas. Abbey Chatters, DO, Charlsie Quest. Insurance claims handler, Therapist, sports,                            Suzan Garibaldi. Risa Grill, Technician Referring MD:              Medicines:                See the Anesthesia note for documentation of the                            administered medications Complications:            No immediate complications. Estimated Blood Loss:     Estimated blood loss was minimal. Procedure:                Pre-Anesthesia Assessment:                           - The anesthesia plan was to use monitored                            anesthesia care (MAC).                           After obtaining informed consent, the colonoscope                            was passed under direct vision. Throughout the                            procedure, the patient's blood pressure, pulse, and                            oxygen saturations were monitored continuously. The                            [id number]-HQ190L ([id number]) scope was introduced through                            the anus and advanced to the the terminal ileum,                            with identification of the appendiceal orifice and                            IC valve. The colonoscopy was performed without                            difficulty.  The patient tolerated the procedure                            well. The quality of the bowel preparation was                            evaluated using the BBPS [facility] Bowel Preparation                            Scale) with scores of: Right Colon = 3, Transverse                            Colon = 3 and Left Colon = 3 (entire mucosa seen                            well  with no residual staining, small fragments of                            stool or opaque liquid). The total BBPS score                            equals 9. Scope In: 7:48:09 AM Scope Out: 8:09:56 AM Scope Withdrawal Time: 0 hours 18 minutes 28 seconds  Total Procedure Duration: 0 hours 21 minutes 47 seconds  Findings:      The perianal and digital rectal examinations were normal.      Non-bleeding internal hemorrhoids were found during endoscopy.      Four sessile polyps were found in the ascending colon and cecum. The       polyps were 3 to 4 mm in size. These polyps were removed with a cold       snare. Resection and retrieval were complete.      A 2 mm polyp was found in the transverse colon. The polyp was sessile.       The polyp was removed with a cold biopsy forceps. Resection and       retrieval were complete.      Moderate inflammation characterized by erosions, erythema and shallow       ulcerations was found in the cecum. Biopsies were taken with a cold       forceps for histology.      Patchy mild inflammation characterized by erosions and erythema was       found in the descending colon, in the transverse colon and in the       ascending colon. Biopsies were taken with a cold forceps for histology.      The terminal ileum appeared normal.      A 5 mm polyp was found in the transverse colon. The polyp was sessile.       The polyp was removed with a cold snare. Resection and retrieval were       complete. Impression:               - Non-bleeding internal hemorrhoids.                           - Four 3 to 4 mm polyps in the ascending colon and  in the cecum, removed with a cold snare. Resected                            and retrieved.                           - One 2 mm polyp in the transverse colon, removed                            with a cold biopsy forceps. Resected and retrieved.                           - Moderate inflammation was found in  the cecum                            secondary to colitis. Biopsied.                           - Patchy mild inflammation was found in the                            descending colon, in the transverse colon and in                            the ascending colon secondary to colitis. Biopsied.                           - The examined portion of the ileum was normal.                           - One 5 mm polyp in the transverse colon, removed                            with a cold snare. Resected and retrieved. Moderate Sedation:      Per Anesthesia Care Recommendation:           - Patient has a contact number available for                            emergencies. The signs and symptoms of potential                            delayed complications were discussed with the                            patient. Return to normal activities tomorrow.                            Written discharge instructions were provided to the                            patient.                           -  Resume previous diet.                           - Continue present medications.                           - Await pathology results.                           - Repeat colonoscopy date to be determined after                            pending pathology results are reviewed for                            surveillance based on pathology results.                           - Return to GI clinic in 3 months.                           - Moderate colitis in the cecum. Milder colitis                            extending in the ascending, transverse, and                            descending colon. Sigmoid, rectum appear to be                            spared. Terminal ileum appeared normal. Procedure Code(s):        --- Professional ---                           [phone], Colonoscopy, flexible; with removal of                            tumor(s), polyp(s), or other lesion(s) by snare                             technique                           45380, 59, Colonoscopy, flexible; with biopsy,                            single or multiple Diagnosis Code(s):        --- Professional ---                           K63.5, Polyp of colon                           Z12.11, Encounter for screening for malignant                            neoplasm  of colon                           K52.9, Noninfective gastroenteritis and colitis,                            unspecified                           K64.8, Other hemorrhoids CPT copyright 2019 American Medical Association. All rights reserved. The codes documented in this report are preliminary and upon coder review may  be revised to meet current compliance requirements. Elon Alas. Abbey Chatters, DO [place] Abbey Chatters, DO 05/24/2022 8:15:42 AM This report has been signed electronically. Number of Addenda: 0

## 2022-05-24 NOTE — H&P (Signed)
Primary Care Physician:  [name], DO Primary Gastroenterologist:  Dr. Abbey Chatters  Pre-Procedure History & Physical: HPI:  Joyce Hopkins is a 61 y.o. female is here for a colonoscopy for colon cancer screening purposes.  Patient denies any family history of colorectal cancer.  No melena or hematochezia.  No abdominal pain or unintentional weight loss.  No change in bowel habits.  Overall feels well from a GI standpoint.  Past Medical History:  Diagnosis Date   Allergy    Biliary colic    GERD (gastroesophageal reflux disease)     Past Surgical History:  Procedure Laterality Date   CHOLECYSTECTOMY N/A 07/19/2017   Procedure: LAPAROSCOPIC CHOLECYSTECTOMY WITH INTRAOPERATIVE CHOLANGIOGRAM;  Surgeon: Stark Klein, MD;  Location: [place];  Service: General;  Laterality: N/A;   TMJ ARTHROPLASTY  1995    Prior to Admission medications   Medication Sig Start Date End Date Taking? Authorizing Provider  albuterol (PROAIR HFA) 108 (90 Base) MCG/ACT inhaler Inhale 2 puffs into the lungs every 6 (six) hours as needed for wheezing or shortness of breath. 02/27/22  Yes Gottschalk, Leatrice Jewels M, DO  ALPRAZolam (XANAX) 0.5 MG tablet Take 1 tablet (0.5 mg total) by mouth at bedtime as needed for anxiety (travel). 05/12/22  Yes Gottschalk, Leatrice Jewels M, DO  cetirizine (ZYRTEC) 10 MG tablet Take 10 mg by mouth daily.   Yes [provider]  citalopram (CELEXA) 20 MG tablet TAKE 1 TABLET BY MOUTH DAILY 05/12/22 05/12/23 Yes Gottschalk, Ashly M, DO  hyoscyamine (LEVSIN SL) 0.125 MG SL tablet Place 1 tablet (0.125 mg total) under the tongue every 4 (four) hours as needed. For cramping. Stop if constipation 10/27/20  Yes Annitta Needs, NP  Sod Picosulfate-Mag Ox-Cit Acd (CLENPIQ) 10-3.5-12 MG-GM -GM/175ML SOLN Take 1 kit by mouth as directed. 03/21/22  Yes Eloise Harman, DO  meloxicam (MOBIC) 15 MG tablet Take 1 tablet by mouth once a day as directed for 10-14 days, then as needed Patient not taking: Reported on  05/17/2022 02/22/22       Allergies as of 03/21/2022 - Review Complete 03/17/2022  Allergen Reaction Noted   Acetaminophen Anaphylaxis and Other (See Comments) 02/22/2016   Levofloxacin Other (See Comments) 11/02/2010    Family History  Problem Relation Age of Onset   Heart disease Father    Stroke Neg Hx    Migraines Neg Hx    Neuropathy Neg Hx    Colon cancer Neg Hx    Pancreatic cancer Neg Hx    Celiac disease Neg Hx    Ulcers Neg Hx    Inflammatory bowel disease Neg Hx    Colon polyps Neg Hx    Breast cancer Neg Hx     Social History   Socioeconomic History   Marital status: Married    Spouse name: Lake Bells "wes"   Number of children: 2   Years of education: 16   Highest education level: Not on file  Occupational History   Occupation: part-time  Tobacco Use   Smoking status: Never   Smokeless tobacco: Never  Vaping Use   Vaping Use: Never used  Substance and Sexual Activity   Alcohol use: Yes    Alcohol/week: 0.0 standard drinks of alcohol    Comment: a few times a week wine with supper   Drug use: No   Sexual activity: Yes  Other Topics Concern   Not on file  Social History Narrative   Lives with husband   Caffeine use: 1 code per  day   Coffee daily (very little)   Social Determinants of Health   Financial Resource Strain: Not on file  Food Insecurity: Not on file  Transportation Needs: Not on file  Physical Activity: Not on file  Stress: Not on file  Social Connections: Not on file  Intimate Partner Violence: Not on file    Review of Systems: See HPI, otherwise negative ROS  Physical Exam: Vital signs in last 24 hours: Temp:  [98.6 F (37 C)] 98.6 F (37 C) (09/13 0703) Pulse Rate:  [64] 64 (09/13 0703) Resp:  [18] 18 (09/13 0703) BP: (117)/(65) 117/65 (09/13 0703) SpO2:  [100 %] 100 % (09/13 0703) Weight:  [49 kg] 49 kg (09/13 0658)   General:   Alert,  Well-developed, well-nourished, pleasant and cooperative in NAD Head:  Normocephalic  and atraumatic. Eyes:  Sclera clear, no icterus.   Conjunctiva pink. Ears:  Normal auditory acuity. Nose:  No deformity, discharge,  or lesions. Mouth:  No deformity or lesions, dentition normal. Neck:  Supple; no masses or thyromegaly. Lungs:  Clear throughout to auscultation.   No wheezes, crackles, or rhonchi. No acute distress. Heart:  Regular rate and rhythm; no murmurs, clicks, rubs,  or gallops. Abdomen:  Soft, nontender and nondistended. No masses, hepatosplenomegaly or hernias noted. Normal bowel sounds, without guarding, and without rebound.   Msk:  Symmetrical without gross deformities. Normal posture. Extremities:  Without clubbing or edema. Neurologic:  Alert and  oriented x4;  grossly normal neurologically. Skin:  Intact without significant lesions or rashes. Cervical Nodes:  No significant cervical adenopathy. Psych:  Alert and cooperative. Normal mood and affect.  Impression/Plan: Joyce Hopkins is here for a colonoscopy to be performed for colon cancer screening purposes.  The risks of the procedure including infection, bleed, or perforation as well as benefits, limitations, alternatives and imponderables have been reviewed with the patient. Questions have been answered. All parties agreeable.

## 2022-05-24 NOTE — Anesthesia Postprocedure Evaluation (Signed)
Anesthesia Post Note  Patient: Joyce Hopkins  Procedure(s) Performed: COLONOSCOPY WITH PROPOFOL POLYPECTOMY BIOPSY  Patient location during evaluation: Phase II Anesthesia Type: General Level of consciousness: awake and alert and oriented Pain management: pain level controlled Vital Signs Assessment: post-procedure vital signs reviewed and stable Respiratory status: spontaneous breathing, nonlabored ventilation and respiratory function stable Cardiovascular status: blood pressure returned to baseline and stable Postop Assessment: no apparent nausea or vomiting Anesthetic complications: no   No notable events documented.   Last Vitals:  Vitals:   05/24/22 0703 05/24/22 0814  BP: 117/65 (!) 93/55  Pulse: 64 67  Resp: 18 17  Temp: 37 C (!) 36.4 C  SpO2: 100% 100%    Last Pain:  Vitals:   05/24/22 0814  TempSrc: Oral  PainSc: 0-No pain                 Jennye Runquist C Linsie Lupo

## 2022-05-24 NOTE — Anesthesia Preprocedure Evaluation (Signed)
Anesthesia Evaluation  Patient identified by MRN, date of birth, ID band Patient awake    Reviewed: Allergy & Precautions, NPO status , Patient's Chart, lab work & pertinent test results  Airway Mallampati: I   Neck ROM: Full    Dental  (+) Dental Advisory Given, Teeth Intact   Pulmonary asthma ,    Pulmonary exam normal breath sounds clear to auscultation       Cardiovascular negative cardio ROS Normal cardiovascular exam Rhythm:Regular Rate:Normal     Neuro/Psych PSYCHIATRIC DISORDERS Anxiety negative neurological ROS     GI/Hepatic Neg liver ROS, GERD  Controlled,  Endo/Other  negative endocrine ROS  Renal/GU negative Renal ROS  negative genitourinary   Musculoskeletal negative musculoskeletal ROS (+)   Abdominal   Peds negative pediatric ROS (+)  Hematology negative hematology ROS (+)   Anesthesia Other Findings   Reproductive/Obstetrics negative OB ROS                            Anesthesia Physical Anesthesia Plan  ASA: 2  Anesthesia Plan: General   Post-op Pain Management: Minimal or no pain anticipated   Induction: Intravenous  PONV Risk Score and Plan: Propofol infusion  Airway Management Planned: Natural Airway and Nasal Cannula  Additional Equipment:   Intra-op Plan:   Post-operative Plan:   Informed Consent: I have reviewed the patients History and Physical, chart, labs and discussed the procedure including the risks, benefits and alternatives for the proposed anesthesia with the patient or authorized representative who has indicated his/her understanding and acceptance.     Dental advisory given  Plan Discussed with: Surgeon and CRNA  Anesthesia Plan Comments:         Anesthesia Quick Evaluation

## 2022-05-24 NOTE — Transfer of Care (Signed)
Immediate Anesthesia Transfer of Care Note  Patient: Joyce Hopkins  Procedure(s) Performed: COLONOSCOPY WITH PROPOFOL POLYPECTOMY BIOPSY  Patient Location: PACU  Anesthesia Type:General  Level of Consciousness: awake, alert  and oriented  Airway & Oxygen Therapy: Patient Spontanous Breathing  Post-op Assessment: Report given to RN, Post -op Vital signs reviewed and stable, Patient moving all extremities X 4 and Patient able to stick tongue midline  Post vital signs: Reviewed  Last Vitals:  Vitals Value Taken Time  BP 93/55 05/24/22 0814  Temp 36.4 C 05/24/22 0814  Pulse 67 05/24/22 0814  Resp 17 05/24/22 0814  SpO2 100 % 05/24/22 0814    Last Pain:  Vitals:   05/24/22 0814  TempSrc: Oral  PainSc: 0-No pain         Complications: No notable events documented.

## 2022-05-24 NOTE — Discharge Instructions (Addendum)
  Colonoscopy Discharge Instructions  Read the instructions outlined below and refer to this sheet in the next few weeks. These discharge instructions provide you with general information on caring for yourself after you leave the hospital. Your doctor may also give you specific instructions. While your treatment has been planned according to the most current medical practices available, unavoidable complications occasionally occur.   ACTIVITY You may resume your regular activity, but move at a slower pace for the next 24 hours.  Take frequent rest periods for the next 24 hours.  Walking will help get rid of the air and reduce the bloated feeling in your belly (abdomen).  No driving for 24 hours (because of the medicine (anesthesia) used during the test).   Do not sign any important legal documents or operate any machinery for 24 hours (because of the anesthesia used during the test).  NUTRITION Drink plenty of fluids.  You may resume your normal diet as instructed by your doctor.  Begin with a light meal and progress to your normal diet. Heavy or fried foods are harder to digest and may make you feel sick to your stomach (nauseated).  Avoid alcoholic beverages for 24 hours or as instructed.  MEDICATIONS You may resume your normal medications unless your doctor tells you otherwise.  WHAT YOU CAN EXPECT TODAY Some feelings of bloating in the abdomen.  Passage of more gas than usual.  Spotting of blood in your stool or on the toilet paper.  IF YOU HAD POLYPS REMOVED DURING THE COLONOSCOPY: No aspirin products for 7 days or as instructed.  No alcohol for 7 days or as instructed.  Eat a soft diet for the next 24 hours.  FINDING OUT THE RESULTS OF YOUR TEST Not all test results are available during your visit. If your test results are not back during the visit, make an appointment with your caregiver to find out the results. Do not assume everything is normal if you have not heard from your  caregiver or the medical facility. It is important for you to follow up on all of your test results.  SEEK IMMEDIATE MEDICAL ATTENTION IF: You have more than a spotting of blood in your stool.  Your belly is swollen (abdominal distention).  You are nauseated or vomiting.  You have a temperature over 101.  You have abdominal pain or discomfort that is severe or gets worse throughout the day.   Your colonoscopy revealed 5 polyps which I removed successfully.  You have active inflammation in the right side your colon extending through the mid portion as well, though it is milder in this area.  I took extensive biopsies of your entire colon.  Await pathology results, my office will contact you.  Further recommendations after pathology review.  Follow-up with GI in 3 months  I hope you have a great rest of your week!  Elon Alas. Abbey Chatters, D.O. Gastroenterology and Hepatology [facility]

## 2022-05-25 LAB — SURGICAL PATHOLOGY

## 2022-05-30 ENCOUNTER — Encounter (HOSPITAL_COMMUNITY): Payer: Self-pay | Admitting: Internal Medicine

## 2022-05-31 ENCOUNTER — Ambulatory Visit: Payer: 59 | Attending: Orthopedic Surgery | Admitting: Physical Therapy

## 2022-05-31 ENCOUNTER — Other Ambulatory Visit: Payer: Self-pay

## 2022-05-31 ENCOUNTER — Ambulatory Visit: Payer: 59 | Admitting: Physical Therapy

## 2022-05-31 DIAGNOSIS — M25511 Pain in right shoulder: Secondary | ICD-10-CM | POA: Insufficient documentation

## 2022-05-31 DIAGNOSIS — G8929 Other chronic pain: Secondary | ICD-10-CM | POA: Diagnosis not present

## 2022-05-31 DIAGNOSIS — M25611 Stiffness of right shoulder, not elsewhere classified: Secondary | ICD-10-CM | POA: Diagnosis not present

## 2022-05-31 NOTE — Therapy (Addendum)
OUTPATIENT PHYSICAL THERAPY SHOULDER EVALUATION   Patient Name: Joyce Hopkins MRN: [id number] DOB:05-03-1961, 61 y.o., female Today's Date: 05/31/2022   PT End of Session - 05/31/22 1217     Visit Number 1    Number of Visits 6    Date for PT Re-Evaluation 07/12/22    PT Start Time 0900    PT Stop Time 0945    PT Time Calculation (min) 45 min    Activity Tolerance Patient tolerated treatment well    Behavior During Therapy [facility] for tasks assessed/performed             Past Medical History:  Diagnosis Date   Allergy    Biliary colic    GERD (gastroesophageal reflux disease)    Past Surgical History:  Procedure Laterality Date   BIOPSY  05/24/2022   Procedure: BIOPSY;  Surgeon: Eloise Harman, DO;  [facility];  Service: Endoscopy;;   CHOLECYSTECTOMY N/A 07/19/2017   Procedure: LAPAROSCOPIC CHOLECYSTECTOMY WITH INTRAOPERATIVE CHOLANGIOGRAM;  Surgeon: Stark Klein, MD;  Location: [place];  Service: General;  Laterality: N/A;   COLONOSCOPY WITH PROPOFOL N/A 05/24/2022   Procedure: COLONOSCOPY WITH PROPOFOL;  Surgeon: Eloise Harman, DO;  [facility];  Service: Endoscopy;  Laterality: N/A;  7:30am, asa 2   POLYPECTOMY  05/24/2022   Procedure: POLYPECTOMY;  Surgeon: Eloise Harman, DO;  [facility];  Service: Endoscopy;;   TMJ ARTHROPLASTY  1995   Patient Active Problem List   Diagnosis Date Noted   Encounter for counseling 02/08/2022   Diarrhea 07/10/2019   Abdominal pain, epigastric 05/09/2017   Abnormal LFTs 05/09/2017   Gallbladder sludge 05/09/2017   Mild intermittent asthma without complication 40/98/1191   BPPV (benign paroxysmal positional vertigo) 03/19/2016   Generalized anxiety disorder 03/19/2016   REFERRING PROVIDER: Tania Ade MD  REFERRING DIAG: Right mild adhesive capsulitis  THERAPY DIAG:  Chronic right shoulder pain - Plan: PT plan of care cert/re-cert  Stiffness of right shoulder, not elsewhere  classified - Plan: PT plan of care cert/re-cert  Rationale for Evaluation and Treatment Rehabilitation  ONSET DATE: March 2023.  SUBJECTIVE:                                                                                                                                                                                      SUBJECTIVE STATEMENT: The patient presents to the clinic today with gradual right shoulder ain since March.  She has had injection in her shoulder and they have been helpful.  She is reporting no pain at rest today.  Certain end-range motions (reaching) can increase her pain somewhat producing a dull  or sharp feeling.  She has been doing some stretches.  PERTINENT HISTORY: Unremarkable.  PAIN:  Are you having pain? No  PRECAUTIONS: None  WEIGHT BEARING RESTRICTIONS No  FALLS:  Has patient fallen in last 6 months? No  LIVING ENVIRONMENT: Lives with: lives with their spouse Lives in: House/apartment Has following equipment at home: None  OCCUPATION: Works in a warehouse.  PLOF: Independent  PATIENT GOALS:  Use right shoulder without pain.  OBJECTIVE:   DIAGNOSTIC FINDINGS:  Moderate distal supraspinatus and infraspinatus tendinosis. Intermediate grade rim rent type articular sided tearing of the anterior supraspinatus tendon at the footprint. Moderate distal subscapularis tendinosis. No significant muscle atrophy.   Mild tendinosis and fraying of the biceps tendon at the labral anchor.   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   Mild subacromial-subdeltoid bursitis.   Degenerative superior labral tear extending into the anterior superior labrum.  PATIENT SURVEYS:  FOTO Complete.   POSTURE: Generally good posture.  UPPER EXTREMITY ROM:   Full active right shoulder flexion, ER is 73 degrees, very good behind the back motion.  UPPER EXTREMITY MMT: No notable strength deficits.  PALPATION:  Some c/o pain in the  distal clavicle and anterior shoulder region.   TODAY'S TREATMENT:  HMP and IFC at 80-150 Hz on 40% scan x 20 minutes.  Patient tolerated treatment well with normal modality response following removal of modality.   PATIENT EDUCATION: Education details: See below. Person educated: Patient Education method: Explanation, Demonstration, and Handouts Education comprehension: verbalized understanding and returned demonstration   HOME EXERCISE PROGRAM: [place] by [place] Abby Tucholski Sep 20th, 2023 View at [email or web address] using code: [id number] Page 1 of 2 4 Exercises Shoulder Flexion [place] Stretch Standing in front of the [place], walk your fingers up the [place] until your arm is raised above your hand and you feel a slight stretch in the shoulder. Repeat 10 Times Hold 5 Seconds Complete 2 Sets Perform 4 Times a Day PECTORALIS CORNER STRETCH While standing at a corner of a [place], place your arms on the walls with elobws bent so that your upper arms are horizontal and your forearms are directed upwards as shown. Take one step forward towards the corner. Bend your front knee until a stretch is felt along the front of your chest and/or shoulders. Your arms should be pointed downward towards the ground. NOTE: Your legs should control the stretch by bending or straightening your front knee. Repeat 4 Times Hold 30 Seconds Complete 1 Set Perform 4 Times a Day INTERNAL ROTATION TOWEL STRETCH - IR TOWEL Gently pull up your affected arm behind your back with the assist of a towel Repeat 1 Time Hold 1 Minute Complete 2 Sets Perform 4 Times a Day CANE EXTERNAL ROTATION STRETCH - PILLOW Lie on your back with a pillow under your affect shoulder. Hold a cane or wand with both hands. On the affected side, maintain a 90 degree bend at the elbow with your arm approximately 30-45 degrees away from your side. Use your other arm to push the cane to rotate the affected  arm into a stretch. Hold and then return to starting position and then repeat. Repeat 1 Time Hold 1 Second Complete 1 Set Perform 1 Times a Day Created  ASSESSMENT:  CLINICAL IMPRESSION: The patient presents to [facility] with a gradual onset of right shoulder pain.  Injections have bee quite helpful.  She lacks some right shoulder ER per contralateral comparison.  No strength deficits observed.  Some pain in distal clavicle/anterior shoulder region.  Some movements, such as, reaching out can reproduce her pain.  Added to patient's HEP.  She is very motivated and expected to do very well.  Patient will benefit from skilled physical therapy intervention to address pain and deficits.  OBJECTIVE IMPAIRMENTS decreased ROM and pain.   ACTIVITY LIMITATIONS reach over head  REHAB POTENTIAL: Excellent  CLINICAL DECISION MAKING: Stable/uncomplicated  EVALUATION COMPLEXITY: Low   GOALS:  LONG TERM GOALS: Target date: 07/12/2022  (Remove Blue Hyperlink)  Ind with a HEP. Baseline:  Goal status: INITIAL  2.  Active right shoulder ER to 90 degrees. Baseline:  Goal status: INITIAL  3.  Perform all ADL's with no right shoulder pain. Baseline:  Goal status: INITIAL  PLAN: PT FREQUENCY:  1-2 times a week  PT DURATION: other: 3-6 weeks.  PLANNED INTERVENTIONS: Therapeutic exercises, Therapeutic activity, Gait training, Patient/Family education, Joint mobilization, Dry Needling, Electrical stimulation, Cryotherapy, Moist heat, Vasopneumatic device, Traction, Ultrasound, and Manual therapy  PLAN FOR NEXT SESSION: Review HEP, PRE's, RW4.  Modalities as needed.   Giselle Brutus, [place], PT 05/31/2022, 12:37 PM

## 2022-06-07 ENCOUNTER — Ambulatory Visit: Payer: 59 | Admitting: Physical Therapy

## 2022-06-07 ENCOUNTER — Encounter: Payer: Self-pay | Admitting: Physical Therapy

## 2022-06-07 DIAGNOSIS — G8929 Other chronic pain: Secondary | ICD-10-CM | POA: Diagnosis not present

## 2022-06-07 DIAGNOSIS — M25611 Stiffness of right shoulder, not elsewhere classified: Secondary | ICD-10-CM

## 2022-06-07 DIAGNOSIS — M25511 Pain in right shoulder: Secondary | ICD-10-CM | POA: Diagnosis not present

## 2022-06-07 NOTE — Therapy (Signed)
OUTPATIENT PHYSICAL THERAPY SHOULDER EVALUATION   Patient Name: Joyce Hopkins MRN: [id number] DOB:12/11/1960, 61 y.o., female Today's Date: 06/07/2022   PT End of Session - 06/07/22 1020     Visit Number 2    Number of Visits 6    Date for PT Re-Evaluation 07/12/22    PT Start Time 4098    PT Stop Time 0859    PT Time Calculation (min) 49 min    Activity Tolerance Patient tolerated treatment well    Behavior During Therapy [facility] for tasks assessed/performed              Past Medical History:  Diagnosis Date   Allergy    Biliary colic    GERD (gastroesophageal reflux disease)    Past Surgical History:  Procedure Laterality Date   BIOPSY  05/24/2022   Procedure: BIOPSY;  Surgeon: Eloise Harman, DO;  [facility];  Service: Endoscopy;;   CHOLECYSTECTOMY N/A 07/19/2017   Procedure: LAPAROSCOPIC CHOLECYSTECTOMY WITH INTRAOPERATIVE CHOLANGIOGRAM;  Surgeon: Stark Klein, MD;  Location: [place];  Service: General;  Laterality: N/A;   COLONOSCOPY WITH PROPOFOL N/A 05/24/2022   Procedure: COLONOSCOPY WITH PROPOFOL;  Surgeon: Eloise Harman, DO;  [facility];  Service: Endoscopy;  Laterality: N/A;  7:30am, asa 2   POLYPECTOMY  05/24/2022   Procedure: POLYPECTOMY;  Surgeon: Eloise Harman, DO;  [facility];  Service: Endoscopy;;   TMJ ARTHROPLASTY  1995   Patient Active Problem List   Diagnosis Date Noted   Encounter for counseling 02/08/2022   Diarrhea 07/10/2019   Abdominal pain, epigastric 05/09/2017   Abnormal LFTs 05/09/2017   Gallbladder sludge 05/09/2017   Mild intermittent asthma without complication 11/91/4782   BPPV (benign paroxysmal positional vertigo) 03/19/2016   Generalized anxiety disorder 03/19/2016   REFERRING PROVIDER: Tania Ade MD  REFERRING DIAG: Right mild adhesive capsulitis  THERAPY DIAG:  Chronic right shoulder pain  Stiffness of right shoulder, not elsewhere classified  Rationale for Evaluation and  Treatment Rehabilitation  ONSET DATE: March 2023.  SUBJECTIVE:                                                                                                                                                                                      SUBJECTIVE STATEMENT: Sore since last treatment.   PERTINENT HISTORY: Unremarkable.  PAIN:  Are you having pain? No Sore.  PATIENT GOALS:  Use right shoulder without pain.  OBJECTIVE:    TODAY'S TREATMENT:  UBE at 90 RPM's 8 minutes (4 minutes forward and 4 minutes backward), RW4 to fatigue, SDLY ER to fatigue x 2, in supine PROM to patient's  right shoulder x 8 minutes.  STW/M x 13 minutes with ischemic release x 13 minutes to patient's right UT, Lev scap and Supraspinatus region.  PATIENT EDUCATION: Education details: [place] 4 Person educated: Patient Education method: Explanation, Demonstration, and Handouts Education comprehension: verbalized understanding and returned demonstration   HOME EXERCISE PROGRAM:  Yellow and red theraband  provided for HEP.  ASSESSMENT:  CLINICAL IMPRESSION: Patient's right shoulder sore.  OBJECTIVE IMPAIRMENTS decreased ROM and pain.   ACTIVITY LIMITATIONS reach over head  REHAB POTENTIAL: Excellent  CLINICAL DECISION MAKING: Stable/uncomplicated  EVALUATION COMPLEXITY: Low   GOALS:  LONG TERM GOALS: Target date: 07/19/2022  (Remove Blue Hyperlink)  Ind with a HEP. Baseline:  Goal status: INITIAL  2.  Active right shoulder ER to 90 degrees. Baseline:  Goal status: INITIAL  3.  Perform all ADL's with no right shoulder pain. Baseline:  Goal status: INITIAL  PLAN: PT FREQUENCY:  1-2 times a week  PT DURATION: other: 3-6 weeks.  PLANNED INTERVENTIONS: Therapeutic exercises, Therapeutic activity, Gait training, Patient/Family education, Joint mobilization, Dry Needling, Electrical stimulation, Cryotherapy, Moist heat, Vasopneumatic device, Traction, Ultrasound, and Manual  therapy  PLAN FOR NEXT SESSION: Review HEP, PRE's, RW4.  Modalities as needed.   Aries Kasa, [place], PT 06/07/2022, 10:21 AM

## 2022-06-14 ENCOUNTER — Encounter: Payer: Self-pay | Admitting: *Deleted

## 2022-06-14 ENCOUNTER — Ambulatory Visit: Payer: 59 | Attending: Orthopedic Surgery | Admitting: *Deleted

## 2022-06-14 DIAGNOSIS — M25511 Pain in right shoulder: Secondary | ICD-10-CM | POA: Diagnosis not present

## 2022-06-14 DIAGNOSIS — M25611 Stiffness of right shoulder, not elsewhere classified: Secondary | ICD-10-CM | POA: Insufficient documentation

## 2022-06-14 DIAGNOSIS — G8929 Other chronic pain: Secondary | ICD-10-CM | POA: Insufficient documentation

## 2022-06-14 NOTE — Therapy (Signed)
OUTPATIENT PHYSICAL THERAPY SHOULDER EVALUATION   Patient Name: Joyce Hopkins MRN: [id number] DOB:Nov 02, 1960, 61 y.o., female Today's Date: 06/14/2022   PT End of Session - 06/14/22 0910     Visit Number 3    Number of Visits 6    Date for PT Re-Evaluation 07/12/22    PT Start Time 0900    PT Stop Time 7782    PT Time Calculation (min) 49 min              Past Medical History:  Diagnosis Date   Allergy    Biliary colic    GERD (gastroesophageal reflux disease)    Past Surgical History:  Procedure Laterality Date   BIOPSY  05/24/2022   Procedure: BIOPSY;  Surgeon: Eloise Harman, DO;  [facility];  Service: Endoscopy;;   CHOLECYSTECTOMY N/A 07/19/2017   Procedure: LAPAROSCOPIC CHOLECYSTECTOMY WITH INTRAOPERATIVE CHOLANGIOGRAM;  Surgeon: Stark Klein, MD;  Location: [facility];  Service: General;  Laterality: N/A;   COLONOSCOPY WITH PROPOFOL N/A 05/24/2022   Procedure: COLONOSCOPY WITH PROPOFOL;  Surgeon: Eloise Harman, DO;  [facility];  Service: Endoscopy;  Laterality: N/A;  7:30am, asa 2   POLYPECTOMY  05/24/2022   Procedure: POLYPECTOMY;  Surgeon: Eloise Harman, DO;  [facility];  Service: Endoscopy;;   TMJ ARTHROPLASTY  1995   Patient Active Problem List   Diagnosis Date Noted   Encounter for counseling 02/08/2022   Diarrhea 07/10/2019   Abdominal pain, epigastric 05/09/2017   Abnormal LFTs 05/09/2017   Gallbladder sludge 05/09/2017   Mild intermittent asthma without complication 42/35/3614   BPPV (benign paroxysmal positional vertigo) 03/19/2016   Generalized anxiety disorder 03/19/2016   REFERRING PROVIDER: Tania Ade MD  REFERRING DIAG: Right mild adhesive capsulitis  THERAPY DIAG:  Chronic right shoulder pain  Stiffness of right shoulder, not elsewhere classified  Rationale for Evaluation and Treatment Rehabilitation  ONSET DATE: March 2023.  SUBJECTIVE:                                                                                                                                                                                       SUBJECTIVE STATEMENT: Sore since last treatment. RT shldr has been achey   PERTINENT HISTORY: Unremarkable.  PAIN:  Are you having pain? No Sore.  PATIENT GOALS:  Use right shoulder without pain.  OBJECTIVE:    TODAY'S TREATMENT:          10/4   UBE at 90 RPM's x 8 minutes (4 minutes forward and 4 minutes backward), Manual PROM for abduction, IR, ER ,H-ABD, and HBB f/ b resisted isometrics at end range  x6 hold 6 secs with low intensity levels and no pain  STW TPR to LT medial border of scapula IFC and HMP x 15 mins RT shldr    PATIENT EDUCATION: Education details: [place] 4 Person educated: Patient Education method: Explanation, Demonstration, and Handouts Education comprehension: verbalized understanding and returned demonstration   HOME EXERCISE PROGRAM:  Yellow and red theraband  provided for HEP.  ASSESSMENT:  CLINICAL IMPRESSION: Pt arrived today doing fairly well , but with  RT shldr achey and sore. Rx focused on therex as well as end-range mm activation isometrics at low levels. STW also performed to RT periscapular medial border with good TPR. Normal modality response after Rx.  OBJECTIVE IMPAIRMENTS decreased ROM and pain.   ACTIVITY LIMITATIONS reach over head  REHAB POTENTIAL: Excellent  CLINICAL DECISION MAKING: Stable/uncomplicated  EVALUATION COMPLEXITY: Low   GOALS:  LONG TERM GOALS: Target date: 07/26/2022  (Remove Blue Hyperlink)  Ind with a HEP. Baseline:  Goal status: INITIAL  2.  Active right shoulder ER to 90 degrees. Baseline:  Goal status: INITIAL  3.  Perform all ADL's with no right shoulder pain. Baseline:  Goal status: INITIAL  PLAN: PT FREQUENCY:  1-2 times a week  PT DURATION: other: 3-6 weeks.  PLANNED INTERVENTIONS: Therapeutic exercises, Therapeutic activity, Gait training,  Patient/Family education, Joint mobilization, Dry Needling, Electrical stimulation, Cryotherapy, Moist heat, Vasopneumatic device, Traction, Ultrasound, and Manual therapy  PLAN FOR NEXT SESSION: Review HEP, PRE's, RW4.  Modalities as needed.   Nyazia Canevari,[name], PTA 06/14/2022, 1:30 PM

## 2022-06-19 DIAGNOSIS — M67911 Unspecified disorder of synovium and tendon, right shoulder: Secondary | ICD-10-CM | POA: Diagnosis not present

## 2022-06-21 ENCOUNTER — Ambulatory Visit: Payer: 59 | Admitting: *Deleted

## 2022-06-21 ENCOUNTER — Encounter: Payer: Self-pay | Admitting: *Deleted

## 2022-06-21 DIAGNOSIS — M25511 Pain in right shoulder: Secondary | ICD-10-CM | POA: Diagnosis not present

## 2022-06-21 DIAGNOSIS — G8929 Other chronic pain: Secondary | ICD-10-CM

## 2022-06-21 DIAGNOSIS — M25611 Stiffness of right shoulder, not elsewhere classified: Secondary | ICD-10-CM | POA: Diagnosis not present

## 2022-06-21 NOTE — Therapy (Signed)
OUTPATIENT PHYSICAL THERAPY SHOULDER EVALUATION   Patient Name: Joyce Hopkins MRN: [id number] DOB:[birth year], 61 y.o., female Today's Date: 06/21/2022   PT End of Session - 06/21/22 0902     Visit Number 4    Number of Visits 6    Date for PT Re-Evaluation 07/12/22    PT Start Time 0900    PT Stop Time 0947    PT Time Calculation (min) 46 min              Past Medical History:  Diagnosis Date   Allergy    Biliary colic    GERD (gastroesophageal reflux disease)    Past Surgical History:  Procedure Laterality Date   BIOPSY  05/24/2022   Procedure: BIOPSY;  Surgeon: Eloise Harman, DO;  [facility];  Service: Endoscopy;;   CHOLECYSTECTOMY N/A 07/19/2017   Procedure: LAPAROSCOPIC CHOLECYSTECTOMY WITH INTRAOPERATIVE CHOLANGIOGRAM;  Surgeon: Stark Klein, MD;  Location: [facility];  Service: General;  Laterality: N/A;   COLONOSCOPY WITH PROPOFOL N/A 05/24/2022   Procedure: COLONOSCOPY WITH PROPOFOL;  Surgeon: Eloise Harman, DO;  [facility];  Service: Endoscopy;  Laterality: N/A;  7:30am, asa 2   POLYPECTOMY  05/24/2022   Procedure: POLYPECTOMY;  Surgeon: Eloise Harman, DO;  [facility];  Service: Endoscopy;;   TMJ ARTHROPLASTY  1995   Patient Active Problem List   Diagnosis Date Noted   Encounter for counseling 02/08/2022   Diarrhea 07/10/2019   Abdominal pain, epigastric 05/09/2017   Abnormal LFTs 05/09/2017   Gallbladder sludge 05/09/2017   Mild intermittent asthma without complication 09/62/8366   BPPV (benign paroxysmal positional vertigo) 03/19/2016   Generalized anxiety disorder 03/19/2016   REFERRING PROVIDER: Tania Ade MD  REFERRING DIAG: Right mild adhesive capsulitis  THERAPY DIAG:  Chronic right shoulder pain  Stiffness of right shoulder, not elsewhere classified  Rationale for Evaluation and Treatment Rehabilitation  ONSET DATE: March 2023.  SUBJECTIVE:                                                                                                                                                                                       SUBJECTIVE STATEMENT: Doing great. Ready to DC. No pain today   PERTINENT HISTORY: Unremarkable.  PAIN:  Are you having pain? No Sore.  PATIENT GOALS:  Use right shoulder without pain.  OBJECTIVE:    TODAY'S TREATMENT:          10/4  Nustep L1 x 8 minutes , RW 4 with Red tband to fatigue each way,  [place] push ups x 10 Handout given with good demonstration Manual PROM for  ER ,  oscillations to relax . All AROM WNL's  IFC and HMP x 15 mins RT shldr    PATIENT EDUCATION: Education details: [place] 4 Person educated: Patient Education method: Explanation, Demonstration, and Handouts Education comprehension: verbalized understanding and returned demonstration   HOME EXERCISE PROGRAM:  Yellow and red theraband  provided for HEP.  ASSESSMENT:  CLINICAL IMPRESSION: FOTO complete. Pt arrived today doing very well and reports ready  to DC to HEP. HEP reviewed with added [place] push-ups. All ROM and LTGs were checked and Pt's ROM is WNL's now and was able to meet all LTG's except She still has mild pain with ADL's at times. DC to HEP       ACTIVITY LIMITATIONS reach over head  REHAB POTENTIAL: Excellent  CLINICAL DECISION MAKING: Stable/uncomplicated  EVALUATION COMPLEXITY: Low   GOALS:  LONG TERM GOALS: Target date: 08/02/2022  (Remove Blue Hyperlink)  Ind with a HEP. Baseline:  Goal status: MET  2.  Active right shoulder ER to 90 degrees. Baseline:  Goal status: MET  3.  Perform all ADL's with no right shoulder pain. Baseline:  Goal status: Partially  MET. Still with pain at times  PLAN: PT FREQUENCY:  1-2 times a week  PT DURATION: other: 3-6 weeks.  PLANNED INTERVENTIONS: Therapeutic exercises, Therapeutic activity, Gait training, Patient/Family education, Joint mobilization, Dry Needling, Electrical stimulation,  Cryotherapy, Moist heat, Vasopneumatic device, Traction, Ultrasound, and Manual therapy  PLAN FOR NEXT SESSION: Review HEP, PRE's, RW4.  Modalities as needed.   [name],[name], PTA 06/21/2022, 12:41 PM   PHYSICAL THERAPY DISCHARGE SUMMARY  Visits from Start of Care: 4.  Current functional level related to goals / functional outcomes: See above.   Remaining deficits: Patient pleased with progress.  See goal section.   Education / Equipment: HEP.   Patient agrees to discharge. Patient goals were partially met. Patient is being discharged due to being pleased with the current functional level.    Mali Applegate MPT

## 2022-07-05 ENCOUNTER — Encounter: Payer: Self-pay | Admitting: Family Medicine

## 2022-07-05 ENCOUNTER — Ambulatory Visit (INDEPENDENT_AMBULATORY_CARE_PROVIDER_SITE_OTHER): Payer: 59 | Admitting: Family Medicine

## 2022-07-05 VITALS — BP 107/66 | HR 82 | Temp 97.9°F | Ht 64.0 in | Wt 112.0 lb

## 2022-07-05 DIAGNOSIS — R933 Abnormal findings on diagnostic imaging of other parts of digestive tract: Secondary | ICD-10-CM | POA: Diagnosis not present

## 2022-07-05 DIAGNOSIS — Z13220 Encounter for screening for lipoid disorders: Secondary | ICD-10-CM | POA: Diagnosis not present

## 2022-07-05 DIAGNOSIS — Z0000 Encounter for general adult medical examination without abnormal findings: Secondary | ICD-10-CM

## 2022-07-05 DIAGNOSIS — Z0001 Encounter for general adult medical examination with abnormal findings: Secondary | ICD-10-CM | POA: Diagnosis not present

## 2022-07-05 DIAGNOSIS — Z23 Encounter for immunization: Secondary | ICD-10-CM

## 2022-07-05 DIAGNOSIS — F418 Other specified anxiety disorders: Secondary | ICD-10-CM

## 2022-07-05 DIAGNOSIS — F411 Generalized anxiety disorder: Secondary | ICD-10-CM | POA: Diagnosis not present

## 2022-07-05 DIAGNOSIS — K529 Noninfective gastroenteritis and colitis, unspecified: Secondary | ICD-10-CM

## 2022-07-05 NOTE — Patient Instructions (Signed)

## 2022-07-05 NOTE — Progress Notes (Signed)
I have separately seen and examined the patient. I have discussed the findings and exam with student Dr Jerline Pain and agree with the above note.  My changes/additions are outlined in BLUE.   S: Patient reports that overall she is doing very well.  Has very rare use of Xanax.  Denies any concerning symptoms including excessive daytime sedation, memory changes, visual or auditory hallucinations  She notes that she had colonoscopy and that she does have a close follow-up with gastroenterology.  She has known colitis and has not been informed that there is a possible Crohn's that was suspected on her most recent colonoscopy but she is not surprised over this.  O:  BP 107/66   Pulse 82   Temp 97.9 F (36.6 C) (Temporal)   Ht 5' 4" (1.626 m)   Wt 112 lb (50.8 kg)   SpO2 97%   BMI 19.22 kg/m  General appearance: alert, cooperative, appears stated age, and no distress Head: Normocephalic, without obvious abnormality, atraumatic Eyes: negative findings: lids and lashes normal, conjunctivae and sclerae normal, corneas clear, and pupils equal, round, reactive to light and accomodation Ears: normal TM's and external ear canals both ears Nose: Nares normal. Septum midline. Mucosa normal. No drainage or sinus tenderness. Throat: lips, mucosa, and tongue normal; teeth and gums normal Neck: no adenopathy, no carotid bruit, supple, symmetrical, trachea midline, and thyroid not enlarged, symmetric, no tenderness/mass/nodules Back: symmetric, no curvature. ROM normal. No CVA tenderness. Lungs: clear to auscultation bilaterally Heart: regular rate and rhythm, S1, S2 normal, no murmur, click, rub or gallop Abdomen: soft, non-tender; bowel sounds normal; no masses,  no organomegaly Extremities: extremities normal, atraumatic, no cyanosis or edema Pulses: 2+ and symmetric Skin: Skin color, texture, turgor normal. No rashes or lesions Lymph nodes: Cervical, supraclavicular, and axillary nodes  normal. Neurologic: Grossly normal Psych: Mood stable, speech normal, affect appropriate  Flowsheet Row Office Visit from 07/05/2022 in [place]  PHQ-2 Total Score 0       A/P:  Annual physical exam  Generalized anxiety disorder - Plan: ToxASSURE Select 13 (MW), Urine, CMP14+EGFR  Situational anxiety - Plan: ToxASSURE Select 13 (MW), Urine  Abnormal colonoscopy - Plan: Iron, CBC, Vitamin B12, VITAMIN D 25 Hydroxy (Vit-D Deficiency, Fractures)  Colitis - Plan: CMP14+EGFR, Iron, CBC, Vitamin B12, VITAMIN D 25 Hydroxy (Vit-D Deficiency, Fractures)  Need for immunization against influenza - Plan: Flu Vaccine QUAD 63moIM (Fluarix, Fluzone & Alfiuria Quad PF)  Screening, lipid - Plan: Lipid Panel  Doing extremely well.  UDS was updated today.  CSC already up-to-date.  She has refills on Xanax if needed.  Discussed that there is a possibility of Crohn's disease but I will defer any further discussion and diagnostic/treatment plan to her gastroenterologist.  Discussed that they may not recommend ongoing intermittent treatment with oral antibiotics for presumed colitis if this is in fact Crohn's disease.  The management may be totally different and therefore I did not renew her antibiotics during this visit.  Influenza vaccination administered  Fasting labs collected including labs for vitamin deficiency given body habitus and possible Crohn's.   M. GLajuana Ripple DNorth Las VegasFamily Medicine   ----------------------------------------------------------------------------------------------------------------------------------------------------      EZeppelin Beckstrandis a 61y.o. female with a history of biliary colic and GERD presents to office today for annual physical exam examination.    Concerns today include: Generalized anxiety disorder with panic attack The patient is currently managing generalized anxiety disorder with occasional panic  attacks. She continues  to be compliant with her Celexa medication, which effectively keeps her anxiety under control. The patient occasionally takes a half tablet of Xanax, generally once a month, as needed.   Colitis  Possible Crohn's disease Her bowel movements are currently normal. Reports she is currently is having normal bowel movements, occasional stomach discomfort. Has an appointment scheduled with GI next month to discuss the results of a colonoscopy and biopsy results consistent with Crohn's disease. Recently taken antibiotics in response to a flare-up experienced a couple of weeks ago, which provided significant relief.  Hearing Loss  Patient reports some increased difficulty in hearing. Primarily notices difficulty understanding mumbling and low voices. Reports that she does not want to pursue further investigation of this issue at this time.    Occupation: musician, Marital status: married, Substance use: none Last colonoscopy: 05/24/2022 Last mammogram: 05/17/2022 Last pap smear: 06/23/2021 Refills needed today: None Immunizations needed: Immunization History  Administered Date(s) Administered   Hepatitis A, Adult 05/07/2018, 11/07/2018   Influenza Inj Mdck Quad Pf 06/07/2018   Influenza Split 07/10/2013   Influenza,inj,Quad PF,6+ Mos 06/24/2016, 06/05/2017, 06/08/2020, 06/23/2021   Influenza,trivalent, recombinat, inj, PF 07/10/2013   Influenza-Unspecified 06/05/2017, 06/07/2018   Moderna Sars-Covid-2 Vaccination 12/05/2019, 01/07/2020   Tdap 01/09/2006, 06/23/2021   Zoster Recombinat (Shingrix) 06/08/2020, 08/27/2020    Past Medical History:  Diagnosis Date   Allergy    Biliary colic    GERD (gastroesophageal reflux disease)    Social History   Socioeconomic History   Marital status: Married    Spouse name: Lake Bells "wes"   Number of children: 2   Years of education: 16   Highest education level: Not on file  Occupational History   Occupation: part-time  Tobacco  Use   Smoking status: Never   Smokeless tobacco: Never  Vaping Use   Vaping Use: Never used  Substance and Sexual Activity   Alcohol use: Yes    Alcohol/week: 0.0 standard drinks of alcohol    Comment: a few times a week wine with supper   Drug use: No   Sexual activity: Yes  Other Topics Concern   Not on file  Social History Narrative   Lives with husband   Caffeine use: 1 code per day   Coffee daily (very little)   Social Determinants of Health   Financial Resource Strain: Not on file  Food Insecurity: Not on file  Transportation Needs: Not on file  Physical Activity: Not on file  Stress: Not on file  Social Connections: Not on file  Intimate Partner Violence: Not on file   Past Surgical History:  Procedure Laterality Date   BIOPSY  05/24/2022   Procedure: BIOPSY;  Surgeon: Eloise Harman, DO;  [facility];  Service: Endoscopy;;   CHOLECYSTECTOMY N/A 07/19/2017   Procedure: LAPAROSCOPIC CHOLECYSTECTOMY WITH INTRAOPERATIVE CHOLANGIOGRAM;  Surgeon: Stark Klein, MD;  Location: [facility];  Service: General;  Laterality: N/A;   COLONOSCOPY WITH PROPOFOL N/A 05/24/2022   Procedure: COLONOSCOPY WITH PROPOFOL;  Surgeon: Eloise Harman, DO;  [facility];  Service: Endoscopy;  Laterality: N/A;  7:30am, asa 2   POLYPECTOMY  05/24/2022   Procedure: POLYPECTOMY;  Surgeon: Eloise Harman, DO;  [facility];  Service: Endoscopy;;   TMJ ARTHROPLASTY  1995   Family History  Problem Relation Age of Onset   Heart disease Father    Stroke Neg Hx    Migraines Neg Hx    Neuropathy Neg Hx    Colon  cancer Neg Hx    Pancreatic cancer Neg Hx    Celiac disease Neg Hx    Ulcers Neg Hx    Inflammatory bowel disease Neg Hx    Colon polyps Neg Hx    Breast cancer Neg Hx     Current Outpatient Medications:    albuterol (PROAIR HFA) 108 (90 Base) MCG/ACT inhaler, Inhale 2 puffs into the lungs every 6 (six) hours as needed for wheezing or shortness of  breath., Disp: 6.7 g, Rfl: 0   ALPRAZolam (XANAX) 0.5 MG tablet, Take 1 tablet (0.5 mg total) by mouth at bedtime as needed for anxiety (travel)., Disp: 30 tablet, Rfl: 1   cetirizine (ZYRTEC) 10 MG tablet, Take 10 mg by mouth daily., Disp: , Rfl:    citalopram (CELEXA) 20 MG tablet, TAKE 1 TABLET BY MOUTH DAILY, Disp: 90 tablet, Rfl: 3   hyoscyamine (LEVSIN SL) 0.125 MG SL tablet, Place 1 tablet (0.125 mg total) under the tongue every 4 (four) hours as needed. For cramping. Stop if constipation, Disp: 30 tablet, Rfl: 0   meloxicam (MOBIC) 15 MG tablet, Take 1 tablet by mouth once a day as directed for 10-14 days, then as needed (Patient not taking: Reported on 05/17/2022), Disp: 60 tablet, Rfl: 0  Allergies  Allergen Reactions   Tylenol [Acetaminophen] Anaphylaxis and Other (See Comments)    Wheezing/hives   Levofloxacin Other (See Comments)    Muscle spasms     ROS: Review of Systems Pertinent items are noted in HPI.    Physical exam BP 107/66   Pulse 82   Temp 97.9 F (36.6 C) (Temporal)   Ht 5' 4" (1.626 m)   Wt 112 lb (50.8 kg)   SpO2 97%   BMI 19.22 kg/m  General appearance: alert, cooperative, and appears stated age Head: Normocephalic, without obvious abnormality, atraumatic Eyes: conjunctivae/corneas clear. PERRL, EOM's intact. Fundi benign. Ears: normal TM's and external ear canals both ears Nose: Nares normal. Septum midline. Mucosa normal. No drainage or sinus tenderness. Throat: lips, mucosa, and tongue normal; teeth and gums normal Neck: no adenopathy, no carotid bruit, no JVD, supple, symmetrical, trachea midline, and thyroid not enlarged, symmetric, no tenderness/mass/nodules Lungs: clear to auscultation bilaterally Heart: regular rate and rhythm, S1, S2 normal, no murmur, click, rub or gallop Abdomen: soft, non-tender; bowel sounds normal; no masses,  no organomegaly Extremities: extremities normal, atraumatic, no cyanosis or edema Pulses: 2+ and symmetric    Filed Weights   07/05/22 1027  Weight: 112 lb (50.8 kg)   Assessment/ Plan: Karle Plumber here for annual physical exam.   The patient reports that her anxiety is currently well-controlled. Therefore, I will continue her Xanax and alprazolam medications. Additionally, I will be ordering vitamin level tests due to potential malabsorption issues associated with Crohn's disease, including assessments for Vitamin B12, Vitamin D, and iron. Other laboratory tests to be performed include a complete blood count (CBC), urine drug screen (UDS), comprehensive metabolic panel (CMP), and lipid panel.  During our discussion, we addressed the topic of hearing loss, including the signs and symptoms of worsening hearing, such as the need to increase the TV volume, difficulty hearing voices, and the necessity to read lips for communication. I also provided counseling regarding the documented link between significant hearing loss and dementia. She expressed understanding and will seek a referral for a hearing evaluation if symptoms worsen.  Counseled on healthy lifestyle choices, including diet (rich in fruits, vegetables and lean meats and low in salt and  simple carbohydrates) and exercise (at least 30 minutes of moderate physical activity daily).  Patient to follow up in 1 year for annual exam or sooner if needed.  Stephani Police, MS3

## 2022-07-06 LAB — CBC
Hematocrit: 40.9 % (ref 34.0–46.6)
Hemoglobin: 13.3 g/dL (ref 11.1–15.9)
MCH: 28.9 pg (ref 26.6–33.0)
MCHC: 32.5 g/dL (ref 31.5–35.7)
MCV: 89 fL (ref 79–97)
Platelets: 308 10*3/uL (ref 150–450)
RBC: 4.6 x10E6/uL (ref 3.77–5.28)
RDW: 12.7 % (ref 11.7–15.4)
WBC: 8.7 10*3/uL (ref 3.4–10.8)

## 2022-07-06 LAB — CMP14+EGFR
ALT: 17 IU/L (ref 0–32)
AST: 23 IU/L (ref 0–40)
Albumin/Globulin Ratio: 1.9 (ref 1.2–2.2)
Albumin: 4.3 g/dL (ref 3.9–4.9)
Alkaline Phosphatase: 87 IU/L (ref 44–121)
BUN/Creatinine Ratio: 23 (ref 12–28)
BUN: 16 mg/dL (ref 8–27)
Bilirubin Total: <0.2 mg/dL (ref 0.0–1.2)
CO2: 25 mmol/L (ref 20–29)
Calcium: 9.9 mg/dL (ref 8.7–10.3)
Chloride: 100 mmol/L (ref 96–106)
Creatinine, Ser: 0.71 mg/dL (ref 0.57–1.00)
Globulin, Total: 2.3 g/dL (ref 1.5–4.5)
Glucose: 88 mg/dL (ref 70–99)
Potassium: 4.3 mmol/L (ref 3.5–5.2)
Sodium: 140 mmol/L (ref 134–144)
Total Protein: 6.6 g/dL (ref 6.0–8.5)
eGFR: 96 mL/min/{1.73_m2} (ref >59)

## 2022-07-06 LAB — IRON: Iron: 85 ug/dL (ref 27–139)

## 2022-07-06 LAB — VITAMIN D 25 HYDROXY (VIT D DEFICIENCY, FRACTURES): Vit D, 25-Hydroxy: 29 ng/mL — ABNORMAL LOW (ref 30.0–100.0)

## 2022-07-06 LAB — LIPID PANEL
Chol/HDL Ratio: 3.5 ratio (ref 0.0–4.4)
Cholesterol, Total: 226 mg/dL — ABNORMAL HIGH (ref 100–199)
HDL: 65 mg/dL (ref >39)
LDL Chol Calc (NIH): 141 mg/dL — ABNORMAL HIGH (ref 0–99)
Triglycerides: 116 mg/dL (ref 0–149)
VLDL Cholesterol Cal: 20 mg/dL (ref 5–40)

## 2022-07-06 LAB — VITAMIN B12: Vitamin B-12: 524 pg/mL (ref 232–1245)

## 2022-07-11 LAB — TOXASSURE SELECT 13 (MW), URINE

## 2022-08-02 ENCOUNTER — Ambulatory Visit (INDEPENDENT_AMBULATORY_CARE_PROVIDER_SITE_OTHER): Payer: 59 | Admitting: Gastroenterology

## 2022-08-02 ENCOUNTER — Encounter: Payer: Self-pay | Admitting: Gastroenterology

## 2022-08-02 VITALS — BP 105/66 | HR 77 | Temp 97.8°F | Ht 64.0 in | Wt 112.0 lb

## 2022-08-02 DIAGNOSIS — R197 Diarrhea, unspecified: Secondary | ICD-10-CM

## 2022-08-02 DIAGNOSIS — R933 Abnormal findings on diagnostic imaging of other parts of digestive tract: Secondary | ICD-10-CM | POA: Diagnosis not present

## 2022-08-02 MED ORDER — AZITHROMYCIN 500 MG PO TABS: 500.0000 mg | ORAL_TABLET | Freq: Every day | ORAL | 0 refills | Status: AC

## 2022-08-02 NOTE — Patient Instructions (Signed)
RX for azithromycin '500mg'$  daily for 7 days to have on hand if needed for recurrent diarrhea.  I will be in touch next week after discussing plan with Dr. Abbey Chatters.  Please reach out if recurrent symptoms.

## 2022-08-02 NOTE — Progress Notes (Signed)
GI Office Note    Referring Provider: [name], DO Primary Care Physician:  [name], DO  Primary Gastroenterologist: Elon Alas. Abbey Chatters, DO   Chief Complaint   Chief Complaint  Patient presents with   Follow-up    Wants to discuss MRI results and colitis.     History of Present Illness   Joyce Hopkins is a 61 y.o. female presenting today for follow-up.  Patient last seen in June in the office. Presented with episodic diarrhea at that time.   She has a history of abnormal LFTs in the setting of acute illness/gallbladder sludge.  Concern for beaded appearance of the bile ducts.  Seen at [facility], suspicion that abnormal appearance of bile ducts due to acute illness and sludge. Autoimmune markers were negative.  December 2019 with normal LFTs.  It was recommended to hold off on repeat MRI/MRCP unless new signs or symptoms.  Normal colonoscopy in 2012 at outside facility.   In Feb 2022, she had a GI pathogen panel detected C. difficile toxin A/B but stand-alone C. difficile toxin A+B was negative.  She was treated with 10-day course of vancomycin given her presentation.  Her diarrhea resolved.  In June 2023: GI pathogen panel was negative.  Celiac serologies negative.  LFTs normal.  CBC normal.  CT abdomen pelvis July 2023: Segmental Gieselman thickening of the colon, most prominent from the cecum through the splenic flexure consistent with inflammatory or infectious colitis.  She completed colonoscopy in 05/2022 as outlined below.   Today: Patient states she has now had 3 big flares of abdominal pain, diarrhea. First episode in 10/2020. Treated with vancomycin for possible Cdiff. Diarrhea resolved. Next episode in 02/2022, stool testing negative but CT showed colitis and given her exposure to chickens we empirically treated her with azithromycin for 7 days, and diarrhea resolved. She had been symptom free for over one month when she completed colonoscopy 05/2022 and found  to have persistent colitis. She had another flare in 06/2022, resolved after four days of azithromycin (provided by PCP).   She states her weight has been stable since 03/2022. She notes that with her diarrhea flares in June and October that she had been on Mobic. Currently BMs normal. No abdominal pain. No current blood in stool. When she has episodes of diarrhea/abdominal pain, she will note blood in the stool during those times. Currently avoiding NSAIDs. Her appetite is good. No heartburn, vomiting.   Colonoscopy September 2023: -Terminal ileum appeared normal. - Non-bleeding internal hemorrhoids. - Four 3 to 4 mm polyps in the ascending colon and in the cecum, removed with a cold snare. Resected and retrieved. - One 2 mm polyp in the transverse colon, removed with a cold biopsy forceps. Resected and retrieved. - Moderate inflammation was found in the cecum secondary to colitis. Biopsied. - Patchy mild inflammation was found in the descending colon, in the transverse colon and in the ascending colon secondary to colitis. Biopsied. - The examined portion of the ileum was normal. - One 5 mm polyp in the transverse colon, removed with a cold snare. Resected and retrieved. -Biopsies from cecum showed chronic active colitis with crypt abscesses, negative for granulomas and dysplasia -Biopsies from ascending, transverse colon show evidence of focally active colitis with cryptitis.  Descending colon biopsy showing focally active colitis.  Sigmoid and rectum biopsies negative.  -No adenomatous colon polyps   Medications   Current Outpatient Medications  Medication Sig Dispense Refill   albuterol (PROAIR  HFA) 108 (90 Base) MCG/ACT inhaler Inhale 2 puffs into the lungs every 6 (six) hours as needed for wheezing or shortness of breath. 6.7 g 0   ALPRAZolam (XANAX) 0.5 MG tablet Take 1 tablet (0.5 mg total) by mouth at bedtime as needed for anxiety (travel). 30 tablet 1   cetirizine (ZYRTEC) 10 MG  tablet Take 10 mg by mouth daily.     citalopram (CELEXA) 20 MG tablet TAKE 1 TABLET BY MOUTH DAILY 90 tablet 3   hyoscyamine (LEVSIN SL) 0.125 MG SL tablet Place 1 tablet (0.125 mg total) under the tongue every 4 (four) hours as needed. For cramping. Stop if constipation 30 tablet 0   No current facility-administered medications for this visit.    Allergies   Allergies as of 08/02/2022 - Review Complete 08/02/2022  Allergen Reaction Noted   Tylenol [acetaminophen] Anaphylaxis and Other (See Comments) 02/22/2016   Levofloxacin Other (See Comments) 11/02/2010       Review of Systems   General: Negative for anorexia, weight loss, fever, chills, fatigue, weakness. ENT: Negative for hoarseness, difficulty swallowing , nasal congestion. CV: Negative for chest pain, angina, palpitations, dyspnea on exertion, peripheral edema.  Respiratory: Negative for dyspnea at rest, dyspnea on exertion, cough, sputum, wheezing.  GI: See history of present illness. GU:  Negative for dysuria, hematuria, urinary incontinence, urinary frequency, nocturnal urination.  Endo: Negative for unusual weight change.     Physical Exam   BP 105/66 (BP Location: Right Arm, Patient Position: Sitting, Cuff Size: Normal)   Pulse 77   Temp 97.8 F (36.6 C) (Oral)   Ht '5\' 4"'$  (1.626 m)   Wt 112 lb (50.8 kg)   SpO2 98%   BMI 19.22 kg/m    General: Well-nourished, well-developed in no acute distress.  Eyes: No icterus. Mouth: Oropharyngeal mucosa moist and pink , no lesions erythema or exudate. Abdomen: Bowel sounds are normal, nontender, nondistended, no hepatosplenomegaly or masses,  no abdominal bruits or hernia , no rebound or guarding.  Rectal: not performed Extremities: No lower extremity edema. No clubbing or deformities. Neuro: Alert and oriented x 4   Skin: Warm and dry, no jaundice.   Psych: Alert and cooperative, normal mood and affect.  Labs   Lab Results  Component Value Date   CREATININE 0.71  07/05/2022   BUN 16 07/05/2022   NA 140 07/05/2022   K 4.3 07/05/2022   CL 100 07/05/2022   CO2 25 07/05/2022   Lab Results  Component Value Date   ALT 17 07/05/2022   AST 23 07/05/2022   ALKPHOS 87 07/05/2022   BILITOT <0.2 07/05/2022   Lab Results  Component Value Date   WBC 8.7 07/05/2022   HGB 13.3 07/05/2022   HCT 40.9 07/05/2022   MCV 89 07/05/2022   PLT 308 07/05/2022   Lab Results  Component Value Date   IRON 85 07/05/2022   Lab Results  Component Value Date   QJJHERDE08 144 07/05/2022   No results found for: "FOLATE"  Imaging Studies   No results found.  Assessment   Episodic diarrhea/abnormal colon: recent episodes occurred in setting of Mobic use. Notably at time of her colonoscopy she was feeling well, no active abdominal pain or diarrhea. Colonoscopy and biopsies suggestive of inflammatory bowel disease and based on distrubution most c/w Crohn's colitis. Mobic may have contributed to flare but question possibility of drug related colitis. Patient currently feels well. Will discuss further with Dr. Abbey Chatters regarding his plan, ie treat as  Crohn's or follow clinically to see if recurrent symptoms without NSAIDs on board.     PLAN   To discuss with Dr. Abbey Chatters, further recommendations to follow.  Call with any recurrent symptoms. Patient requested to have RX for azithromycin on hand for upcoming cruise.    Laureen Ochs. Bobby Rumpf, [place], [facility]

## 2022-08-22 ENCOUNTER — Other Ambulatory Visit (HOSPITAL_COMMUNITY): Payer: Self-pay

## 2022-08-23 ENCOUNTER — Ambulatory Visit: Payer: 59 | Admitting: Gastroenterology

## 2022-10-02 ENCOUNTER — Ambulatory Visit: Payer: 59 | Admitting: Podiatry

## 2022-10-02 DIAGNOSIS — G5762 Lesion of plantar nerve, left lower limb: Secondary | ICD-10-CM

## 2022-10-02 MED ORDER — BETAMETHASONE SOD PHOS & ACET 6 (3-3) MG/ML IJ SUSP
3.0000 mg | Freq: Once | INTRAMUSCULAR | Status: AC
  Administered 2022-10-02: 3 mg via INTRA_ARTICULAR

## 2022-10-02 NOTE — Progress Notes (Signed)
   Chief Complaint  Patient presents with   Neuroma    Left foot neuroma, sore under the 3rd toe,     HPI: 62 y.o. female presenting today for follow-up evaluation of a Morton's neuroma to the left foot.  Patient states that the last injection did help only temporarily.  She continues to have some tenderness with very mild swelling to the area.  She is concerned because she is leaving on a cruise this Friday and does not want to have any pain associated to the neuroma.  Past Medical History:  Diagnosis Date   Allergy    Biliary colic    GERD (gastroesophageal reflux disease)     Past Surgical History:  Procedure Laterality Date   BIOPSY  05/24/2022   Procedure: BIOPSY;  Surgeon: Eloise Harman, DO;  [facility];  Service: Endoscopy;;   CHOLECYSTECTOMY N/A 07/19/2017   Procedure: LAPAROSCOPIC CHOLECYSTECTOMY WITH INTRAOPERATIVE CHOLANGIOGRAM;  Surgeon: Stark Klein, MD;  Location: [place];  Service: General;  Laterality: N/A;   COLONOSCOPY WITH PROPOFOL N/A 05/24/2022   Procedure: COLONOSCOPY WITH PROPOFOL;  Surgeon: Eloise Harman, DO;  [facility];  Service: Endoscopy;  Laterality: N/A;  7:30am, asa 2   POLYPECTOMY  05/24/2022   Procedure: POLYPECTOMY;  Surgeon: Eloise Harman, DO;  [facility];  Service: Endoscopy;;   TMJ ARTHROPLASTY  1995    Allergies  Allergen Reactions   Tylenol [Acetaminophen] Anaphylaxis and Other (See Comments)    Wheezing/hives   Levofloxacin Other (See Comments)    Muscle spasms     Physical Exam: General: The patient is alert and oriented x3 in no acute distress.  Dermatology: Skin is warm, dry and supple bilateral lower extremities. Negative for open lesions or macerations.  Vascular: Palpable pedal pulses bilaterally. Capillary refill within normal limits.  Negative for any significant edema or erythema  Neurological: Light touch and protective threshold grossly intact  Musculoskeletal Exam: Partial  syndactyly noted second and third digits bilateral.  There continues to be some tenderness to palpation to the second intermetatarsal space left foot.  Pain with lateral compression of the metatarsal heads.  Findings consistent with Morton's neuroma   Assessment: 1.  Morton's neuroma second interspace left 2.  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 Patient evaluated.  2.  Injection of 0.5 cc Celestone Soluspan injected into the second interspace left 3.  Continue [place] running shoes that are wide and do not constrict the toebox area or forefoot 4.  The patient has not dealt with this Morton's neuroma for several months now.  She has tried multiple conservative treatment modalities but the neuroma persist.  MRI warranted at this time. 5.  MRI ordered TOES LT W/O CONTRAST 6.  Return to clinic after MRI to review results and discuss further treatment options     Edrick Kins, DPM [facility]  Dr. Edrick Kins, DPM    [address], [place] [address]                Office [phone]  Fax [phone]

## 2022-10-30 ENCOUNTER — Telehealth: Payer: Self-pay | Admitting: Gastroenterology

## 2022-10-30 NOTE — Telephone Encounter (Signed)
Please schedule routine follow up with Dr. Abbey Chatters ONLY in four months for ?Crohn's.

## 2022-11-02 MED ORDER — BUDESONIDE 3 MG PO CPEP: ORAL_CAPSULE | ORAL | 0 refills | Status: DC

## 2022-11-07 ENCOUNTER — Other Ambulatory Visit (HOSPITAL_COMMUNITY): Payer: Self-pay

## 2022-11-07 ENCOUNTER — Other Ambulatory Visit: Payer: Self-pay

## 2022-11-08 ENCOUNTER — Ambulatory Visit (INDEPENDENT_AMBULATORY_CARE_PROVIDER_SITE_OTHER): Payer: 59 | Admitting: Internal Medicine

## 2022-11-08 ENCOUNTER — Encounter: Payer: Self-pay | Admitting: Internal Medicine

## 2022-11-08 VITALS — BP 107/69 | HR 76 | Temp 98.1°F | Ht 64.0 in | Wt 112.0 lb

## 2022-11-08 DIAGNOSIS — R197 Diarrhea, unspecified: Secondary | ICD-10-CM

## 2022-11-08 DIAGNOSIS — R933 Abnormal findings on diagnostic imaging of other parts of digestive tract: Secondary | ICD-10-CM

## 2022-11-08 DIAGNOSIS — K5010 Crohn's disease of large intestine without complications: Secondary | ICD-10-CM

## 2022-11-08 NOTE — Patient Instructions (Signed)
Continue on budesonide for a full 3 months.  Okay to take azithromycin on top of this.  We will need to repeat colonoscopy at some point though we will hold off for now.  Follow-up in June which is already scheduled.  If anything changes then let me know.  It was very nice seeing you again today.  Dr. Abbey Chatters

## 2022-11-08 NOTE — Progress Notes (Signed)
Referring Provider: [name], DO Primary Care Physician:  [name], DO Primary GI:  Dr. Abbey Chatters  Chief Complaint  Patient presents with   Diarrhea    Taking budesonide currently for diarrhea and it helps some but still having diarrhea and cramping.     HPI:   Joyce Hopkins is a 62 y.o. female who presents to clinic today for follow up visit. Complicated GI history.   She has a history of abnormal LFTs in the setting of acute illness/gallbladder sludge.  Concern for beaded appearance of the bile ducts.  Seen at [facility], suspicion that abnormal appearance of bile ducts due to acute illness and sludge. Autoimmune markers were negative.  December 2019 with normal LFTs.  It was recommended to hold off on repeat MRI/MRCP unless new signs or symptoms.  Normal colonoscopy in 2012 at outside facility.    In Feb 2022, she had a GI pathogen panel detected C. difficile toxin A/B but stand-alone C. difficile toxin A+B was negative.  She was treated with 10-day course of vancomycin given her presentation.  Her diarrhea resolved.   In June 2023: GI pathogen panel was negative.  Celiac serologies negative.  LFTs normal.  CBC normal.   CT abdomen pelvis July 2023: Segmental Oley thickening of the colon, most prominent from the cecum through the splenic flexure consistent with inflammatory or infectious colitis.   Colonoscopy September 2023: -Terminal ileum appeared normal. - Non-bleeding internal hemorrhoids. - Four 3 to 4 mm polyps in the ascending colon and in the cecum, removed with a cold snare. Resected and retrieved. - One 2 mm polyp in the transverse colon, removed with a cold biopsy forceps. Resected and retrieved. - Moderate inflammation was found in the cecum secondary to colitis. Biopsied. - Patchy mild inflammation was found in the descending colon, in the transverse colon and in the ascending colon secondary to colitis. Biopsied. - The examined portion of the ileum  was normal. - One 5 mm polyp in the transverse colon, removed with a cold snare. Resected and retrieved. -Biopsies from cecum showed chronic active colitis with crypt abscesses, negative for granulomas and dysplasia -Biopsies from ascending, transverse colon show evidence of focally active colitis with cryptitis.  Descending colon biopsy showing focally active colitis.  Sigmoid and rectum biopsies negative. Polyps removed were inflammatory.  Started on Budesonide 11/02/22.  Today, states she is doing okay. Continues to have diarrhea 2-3 times daily. No mucus or blood in stool. Denies abdominal pain.     Past Medical History:  Diagnosis Date   Allergy    Biliary colic    GERD (gastroesophageal reflux disease)     Past Surgical History:  Procedure Laterality Date   BIOPSY  05/24/2022   Procedure: BIOPSY;  Surgeon: Eloise Harman, DO;  [facility];  Service: Endoscopy;;   CHOLECYSTECTOMY N/A 07/19/2017   Procedure: LAPAROSCOPIC CHOLECYSTECTOMY WITH INTRAOPERATIVE CHOLANGIOGRAM;  Surgeon: Stark Klein, MD;  Location: [place];  Service: General;  Laterality: N/A;   COLONOSCOPY WITH PROPOFOL N/A 05/24/2022   Procedure: COLONOSCOPY WITH PROPOFOL;  Surgeon: Eloise Harman, DO;  [facility];  Service: Endoscopy;  Laterality: N/A;  7:30am, asa 2   POLYPECTOMY  05/24/2022   Procedure: POLYPECTOMY;  Surgeon: Eloise Harman, DO;  [facility];  Service: Endoscopy;;   TMJ ARTHROPLASTY  1995    Current Outpatient Medications  Medication Sig Dispense Refill   albuterol (PROAIR HFA) 108 (90 Base) MCG/ACT inhaler  Inhale 2 puffs into the lungs every 6 (six) hours as needed for wheezing or shortness of breath. 6.7 g 0   ALPRAZolam (XANAX) 0.5 MG tablet Take 1 tablet (0.5 mg total) by mouth at bedtime as needed for anxiety (travel). 30 tablet 1   budesonide (ENTOCORT EC) 3 MG 24 hr capsule '9mg'$  by mouth daily for 30 days, then '6mg'$  by mouth daily for 30 days, then  '3mg'$  by mouth daily for 30 days. 180 capsule 0   cetirizine (ZYRTEC) 10 MG tablet Take 10 mg by mouth daily.     citalopram (CELEXA) 20 MG tablet TAKE 1 TABLET BY MOUTH DAILY 90 tablet 3   hyoscyamine (LEVSIN SL) 0.125 MG SL tablet Place 1 tablet (0.125 mg total) under the tongue every 4 (four) hours as needed. For cramping. Stop if constipation 30 tablet 0   No current facility-administered medications for this visit.    Allergies as of 11/08/2022 - Review Complete 11/08/2022  Allergen Reaction Noted   Tylenol [acetaminophen] Anaphylaxis and Other (See Comments) 02/22/2016   Levofloxacin Other (See Comments) 11/02/2010    Family History  Problem Relation Age of Onset   Heart disease Father    Stroke Neg Hx    Migraines Neg Hx    Neuropathy Neg Hx    Colon cancer Neg Hx    Pancreatic cancer Neg Hx    Celiac disease Neg Hx    Ulcers Neg Hx    Inflammatory bowel disease Neg Hx    Colon polyps Neg Hx    Breast cancer Neg Hx     Social History   Socioeconomic History   Marital status: Married    Spouse name: Lake Bells "wes"   Number of children: 2   Years of education: 16   Highest education level: Not on file  Occupational History   Occupation: part-time  Tobacco Use   Smoking status: Never    Passive exposure: Never   Smokeless tobacco: Never  Vaping Use   Vaping Use: Never used  Substance and Sexual Activity   Alcohol use: Yes    Alcohol/week: 0.0 standard drinks of alcohol    Comment: a few times a week wine with supper   Drug use: No   Sexual activity: Yes  Other Topics Concern   Not on file  Social History Narrative   Lives with husband   Caffeine use: 1 code per day   Coffee daily (very little)   Social Determinants of Health   Financial Resource Strain: Not on file  Food Insecurity: Not on file  Transportation Needs: Not on file  Physical Activity: Not on file  Stress: Not on file  Social Connections: Not on file    Subjective: Review of Systems   Constitutional:  Negative for chills and fever.  HENT:  Negative for congestion and hearing loss.   Eyes:  Negative for blurred vision and double vision.  Respiratory:  Negative for cough and shortness of breath.   Cardiovascular:  Negative for chest pain and palpitations.  Gastrointestinal:  Positive for diarrhea. Negative for abdominal pain, blood in stool, constipation, heartburn, melena and vomiting.  Genitourinary:  Negative for dysuria and urgency.  Musculoskeletal:  Negative for joint pain and myalgias.  Skin:  Negative for itching and rash.  Neurological:  Negative for dizziness and headaches.  Psychiatric/Behavioral:  Negative for depression. The patient is not nervous/anxious.      Objective: BP 107/69 (BP Location: Left Arm, Patient Position: Sitting, Cuff Size: Normal)  Pulse 76   Temp 98.1 F (36.7 C) (Oral)   Ht '5\' 4"'$  (1.626 m)   Wt 112 lb (50.8 kg)   BMI 19.22 kg/m  Physical Exam Constitutional:      Appearance: Normal appearance.  HENT:     Head: Normocephalic and atraumatic.  Eyes:     Extraocular Movements: Extraocular movements intact.     Conjunctiva/sclera: Conjunctivae normal.  Cardiovascular:     Rate and Rhythm: Normal rate and regular rhythm.  Pulmonary:     Effort: Pulmonary effort is normal.     Breath sounds: Normal breath sounds.  Abdominal:     General: Bowel sounds are normal.     Palpations: Abdomen is soft.  Musculoskeletal:        General: No swelling. Normal range of motion.     Cervical back: Normal range of motion and neck supple.  Skin:    General: Skin is warm and dry.     Coloration: Skin is not jaundiced.  Neurological:     General: No focal deficit present.     Mental Status: She is alert and oriented to person, place, and time.  Psychiatric:        Mood and Affect: Mood normal.        Behavior: Behavior normal.      Assessment: *Idiopathic inflammatory bowel disease-Crohn's colitis *Abdominal  pain-resolved *Diarrhea  Plan: Discussed patient's colonoscopy results in depth with her today.  Given the chronicity of her symptoms and biopsy results, I believe patient likely has underlying Crohn's colitis.  No longer taking NSAIDs  Currently on budesonide.  Continue for 3 months in an effort to put her into remission.  May need to escalate therapy to biologic depending on her clinical course.  Discussed this in depth with her today as well.  Will need repeat colonoscopy at some point as well though we will hold off for now.  Follow-up in 3 to 4 months.  11/08/2022 2:12 PM   Disclaimer: This note was dictated with voice recognition software. Similar sounding words can inadvertently be transcribed and may not be corrected upon review.

## 2022-12-27 ENCOUNTER — Other Ambulatory Visit: Payer: Self-pay | Admitting: Gastroenterology

## 2022-12-27 NOTE — Telephone Encounter (Signed)
Phoned and spoke with the pt and she states she does have enough to finish out her 30 days @ 3 mg. This was an automatic refill that came through from the pharmacy

## 2022-12-27 NOTE — Telephone Encounter (Signed)
OK, Dr. Marletta Lor asked she be on it for 3 months. Her RX in 10/2022 was writtin with enough for three months so she should not need a refill.   Have her check to make sure she has enough left to finish out 5 days of  and then 30 days at 

## 2022-12-27 NOTE — Telephone Encounter (Signed)
Please ask patient what dose she is taking. Per Dr. Darolyn Rua last ov note, it said to continue for three months but her RX is for a taper. Is she on  daily?

## 2022-12-27 NOTE — Telephone Encounter (Signed)
Phoned and spoke with the pt and was advised by the pt she is on 6 mg (2 tablets) and she has 4 or 5 days left. Then she will go to 3 mg. Please advise

## 2022-12-28 NOTE — Telephone Encounter (Signed)
noted 

## 2023-01-13 ENCOUNTER — Other Ambulatory Visit: Payer: Self-pay | Admitting: Gastroenterology

## 2023-01-18 DIAGNOSIS — M25561 Pain in right knee: Secondary | ICD-10-CM | POA: Diagnosis not present

## 2023-02-05 ENCOUNTER — Other Ambulatory Visit: Payer: Self-pay | Admitting: Internal Medicine

## 2023-02-15 DIAGNOSIS — M25561 Pain in right knee: Secondary | ICD-10-CM | POA: Diagnosis not present

## 2023-02-21 ENCOUNTER — Encounter: Payer: Self-pay | Admitting: Internal Medicine

## 2023-02-21 ENCOUNTER — Ambulatory Visit (INDEPENDENT_AMBULATORY_CARE_PROVIDER_SITE_OTHER): Payer: Self-pay | Admitting: Internal Medicine

## 2023-02-21 VITALS — BP 112/69 | HR 62 | Temp 97.9°F | Ht 64.0 in | Wt 111.5 lb

## 2023-02-21 DIAGNOSIS — K5010 Crohn's disease of large intestine without complications: Secondary | ICD-10-CM

## 2023-02-21 NOTE — Progress Notes (Signed)
Referring Provider: Raliegh Ip, DO Primary Care Physician:  Raliegh Ip, DO Primary GI:  Dr. Marletta Lor  Chief Complaint  Patient presents with   Crohn's Disease    Follow up on crohns. Finished budesonide.      HPI:   Joyce Hopkins is a 62 y.o. female who presents to clinic today for follow up visit. Complicated GI history.   She has a history of abnormal LFTs in the setting of acute illness/gallbladder sludge.  Concern for beaded appearance of the bile ducts.  Seen at [facility], suspicion that abnormal appearance of bile ducts due to acute illness and sludge. Autoimmune markers were negative.  December 2019 with normal LFTs.  It was recommended to hold off on repeat MRI/MRCP unless new signs or symptoms.  Normal colonoscopy in 2012 at outside facility.    In Feb 2022, she had a GI pathogen panel detected C. difficile toxin A/B but stand-alone C. difficile toxin A+B was negative.  She was treated with 10-day course of vancomycin given her presentation.  Her diarrhea resolved.   In June 2023: GI pathogen panel was negative.  Celiac serologies negative.  LFTs normal.  CBC normal.   CT abdomen pelvis July 2023: Segmental Pelc thickening of the colon, most prominent from the cecum through the splenic flexure consistent with inflammatory or infectious colitis.   Colonoscopy September 2023: -Terminal ileum appeared normal. - Non-bleeding internal hemorrhoids. - Four 3 to 4 mm polyps in the ascending colon and in the cecum, removed with a cold snare. Resected and retrieved. - One 2 mm polyp in the transverse colon, removed with a cold biopsy forceps. Resected and retrieved. - Moderate inflammation was found in the cecum secondary to colitis. Biopsied. - Patchy mild inflammation was found in the descending colon, in the transverse colon and in the ascending colon secondary to colitis. Biopsied. - The examined portion of the ileum was normal. - One 5 mm polyp in the transverse  colon, removed with a cold snare. Resected and retrieved. -Biopsies from cecum showed chronic active colitis with crypt abscesses, negative for granulomas and dysplasia -Biopsies from ascending, transverse colon show evidence of focally active colitis with cryptitis.  Descending colon biopsy showing focally active colitis.  Sigmoid and rectum biopsies negative. Polyps removed were inflammatory.  Started on Budesonide 11/02/22 in an effort to put her in remission.   Today, states she is doing well.  Having formed stools.  No melena hematochezia.  No abdominal pain.  No diarrhea.     Past Medical History:  Diagnosis Date   Allergy    Biliary colic    GERD (gastroesophageal reflux disease)     Past Surgical History:  Procedure Laterality Date   BIOPSY  05/24/2022   Procedure: BIOPSY;  Surgeon: Lanelle Bal, DO;  [facility];  Service: Endoscopy;;   CHOLECYSTECTOMY N/A 07/19/2017   Procedure: LAPAROSCOPIC CHOLECYSTECTOMY WITH INTRAOPERATIVE CHOLANGIOGRAM;  Surgeon: Almond Lint, MD;  Location: [facility];  Service: General;  Laterality: N/A;   COLONOSCOPY WITH PROPOFOL N/A 05/24/2022   Procedure: COLONOSCOPY WITH PROPOFOL;  Surgeon: Lanelle Bal, DO;  [facility];  Service: Endoscopy;  Laterality: N/A;  7:30am, asa 2   POLYPECTOMY  05/24/2022   Procedure: POLYPECTOMY;  Surgeon: Lanelle Bal, DO;  [facility];  Service: Endoscopy;;   TMJ ARTHROPLASTY  1995    Current Outpatient Medications  Medication Sig Dispense Refill   albuterol (PROAIR HFA) 108 (90 Base) MCG/ACT  inhaler Inhale 2 puffs into the lungs every 6 (six) hours as needed for wheezing or shortness of breath. 6.7 g 0   ALPRAZolam (XANAX) 0.5 MG tablet Take 1 tablet (0.5 mg total) by mouth at bedtime as needed for anxiety (travel). 30 tablet 1   cetirizine (ZYRTEC) 10 MG tablet Take 10 mg by mouth daily.     citalopram (CELEXA) 20 MG tablet TAKE 1 TABLET BY MOUTH DAILY 90 tablet 3    hyoscyamine (LEVSIN SL) 0.125 MG SL tablet Place 1 tablet (0.125 mg total) under the tongue every 4 (four) hours as needed. For cramping. Stop if constipation 30 tablet 0   No current facility-administered medications for this visit.    Allergies as of 02/21/2023 - Review Complete 02/21/2023  Allergen Reaction Noted   Tylenol [acetaminophen] Anaphylaxis and Other (See Comments) 02/22/2016   Levofloxacin Other (See Comments) 11/02/2010    Family History  Problem Relation Age of Onset   Heart disease Father    Stroke Neg Hx    Migraines Neg Hx    Neuropathy Neg Hx    Colon cancer Neg Hx    Pancreatic cancer Neg Hx    Celiac disease Neg Hx    Ulcers Neg Hx    Inflammatory bowel disease Neg Hx    Colon polyps Neg Hx    Breast cancer Neg Hx     Social History   Socioeconomic History   Marital status: Married    Spouse name: Gerri Spore "wes"   Number of children: 2   Years of education: 16   Highest education level: Not on file  Occupational History   Occupation: part-time  Tobacco Use   Smoking status: Never    Passive exposure: Never   Smokeless tobacco: Never  Vaping Use   Vaping Use: Never used  Substance and Sexual Activity   Alcohol use: Yes    Alcohol/week: 0.0 standard drinks of alcohol    Comment: a few times a week wine with supper   Drug use: No   Sexual activity: Yes  Other Topics Concern   Not on file  Social History Narrative   Lives with husband   Caffeine use: 1 code per day   Coffee daily (very little)   Social Determinants of Health   Financial Resource Strain: Not on file  Food Insecurity: Not on file  Transportation Needs: Not on file  Physical Activity: Not on file  Stress: Not on file  Social Connections: Not on file    Subjective: Review of Systems  Constitutional:  Negative for chills and fever.  HENT:  Negative for congestion and hearing loss.   Eyes:  Negative for blurred vision and double vision.  Respiratory:  Negative for cough  and shortness of breath.   Cardiovascular:  Negative for chest pain and palpitations.  Gastrointestinal:  Positive for diarrhea. Negative for abdominal pain, blood in stool, constipation, heartburn, melena and vomiting.  Genitourinary:  Negative for dysuria and urgency.  Musculoskeletal:  Negative for joint pain and myalgias.  Skin:  Negative for itching and rash.  Neurological:  Negative for dizziness and headaches.  Psychiatric/Behavioral:  Negative for depression. The patient is not nervous/anxious.      Objective: BP 112/69 (BP Location: Left Arm, Patient Position: Sitting, Cuff Size: Normal)   Pulse 62   Temp 97.9 F (36.6 C) (Oral)   Ht 5\' 4"  (1.626 m)   Wt 111 lb 8 oz (50.6 kg)   BMI 19.14 kg/m  Physical Exam  Constitutional:      Appearance: Normal appearance.  HENT:     Head: Normocephalic and atraumatic.  Eyes:     Extraocular Movements: Extraocular movements intact.     Conjunctiva/sclera: Conjunctivae normal.  Cardiovascular:     Rate and Rhythm: Normal rate and regular rhythm.  Pulmonary:     Effort: Pulmonary effort is normal.     Breath sounds: Normal breath sounds.  Abdominal:     General: Bowel sounds are normal.     Palpations: Abdomen is soft.  Musculoskeletal:        General: No swelling. Normal range of motion.     Cervical back: Normal range of motion and neck supple.  Skin:    General: Skin is warm and dry.     Coloration: Skin is not jaundiced.  Neurological:     General: No focal deficit present.     Mental Status: She is alert and oriented to person, place, and time.  Psychiatric:        Mood and Affect: Mood normal.        Behavior: Behavior normal.      Assessment: *Idiopathic inflammatory bowel disease-Crohn's colitis, mild *Abdominal pain-resolved *Diarrhea  Plan: Discussed patient's colonoscopy results in depth with her today.  Given the chronicity of her symptoms and biopsy results, I believe patient likely has underlying  Crohn's colitis.  No longer taking NSAIDs  Completed course of budesonide and appears clinically to be in remission.  Having formed stools.  No melena hematochezia.  No mucus in stools.  Continue to monitor for now.  Discussed high risk of relapse.  If relapse occurs, consider another course of budesonide with azathioprine combination and subsequent maintenance of azathioprine.  Would need TPMT testing prior to start.  May need to escalate therapy to biologic depending on her clinical course.  Discussed this in depth with her today as well.  Will need repeat colonoscopy 6-12 months.   Follow-up in 3 to 4 months.  02/21/2023 10:36 AM   Disclaimer: This note was dictated with voice recognition software. Similar sounding words can inadvertently be transcribed and may not be corrected upon review.

## 2023-02-21 NOTE — Patient Instructions (Signed)
I am happy to hear that you are feeling better.  Will continue to monitor for now.  If you have resurgence of your symptoms, then let us know.  Next step would be another course of budesonide as well as starting on immune modulator such as azathioprine.  We can consider biologic therapy in the future as well.  It was very nice seeing you again today.  Follow-up in GI office in 3 to 4 months.  Dr. Marletta Lor

## 2023-05-18 ENCOUNTER — Other Ambulatory Visit: Payer: Self-pay | Admitting: Gastroenterology

## 2023-05-18 ENCOUNTER — Encounter: Payer: Self-pay | Admitting: Internal Medicine

## 2023-05-23 ENCOUNTER — Telehealth: Payer: Self-pay

## 2023-05-23 NOTE — Telephone Encounter (Signed)
I was on [facility] Friday and Monday.  She needs stool studies to rule out infectious pathogens especially given her history of C. difficile prior.  Please order GI profile at [facility].  If negative, we can place her back on budesonide.  Please have office visit scheduled with app or myself

## 2023-05-23 NOTE — Telephone Encounter (Signed)
Pt sent a MyChart message last Friday. I sent it to you and she just left another message regarding her disappointment of not being contacted because if she had not had an old Rx for a antibiotic she would have had to be in pain all weekend. Please look at her MyChart message

## 2023-05-28 ENCOUNTER — Other Ambulatory Visit (HOSPITAL_COMMUNITY): Payer: Self-pay

## 2023-05-28 ENCOUNTER — Other Ambulatory Visit: Payer: Self-pay | Admitting: Family Medicine

## 2023-05-28 DIAGNOSIS — F411 Generalized anxiety disorder: Secondary | ICD-10-CM

## 2023-05-28 MED ORDER — CITALOPRAM HYDROBROMIDE 20 MG PO TABS
20.0000 mg | ORAL_TABLET | Freq: Every day | ORAL | 0 refills | Status: DC
  Filled 2023-05-28: qty 30, 30d supply, fill #0

## 2023-05-30 ENCOUNTER — Other Ambulatory Visit (HOSPITAL_COMMUNITY): Payer: Self-pay

## 2023-06-14 ENCOUNTER — Other Ambulatory Visit: Payer: Self-pay | Admitting: Internal Medicine

## 2023-06-21 ENCOUNTER — Encounter: Payer: Self-pay | Admitting: *Deleted

## 2023-06-21 ENCOUNTER — Ambulatory Visit: Payer: 59 | Admitting: Internal Medicine

## 2023-06-21 ENCOUNTER — Encounter: Payer: Self-pay | Admitting: Internal Medicine

## 2023-06-21 ENCOUNTER — Other Ambulatory Visit: Payer: Self-pay | Admitting: *Deleted

## 2023-06-21 VITALS — BP 117/77 | HR 62 | Temp 97.5°F | Ht 64.0 in | Wt 112.2 lb

## 2023-06-21 DIAGNOSIS — K5010 Crohn's disease of large intestine without complications: Secondary | ICD-10-CM

## 2023-06-21 DIAGNOSIS — K529 Noninfective gastroenteritis and colitis, unspecified: Secondary | ICD-10-CM | POA: Diagnosis not present

## 2023-06-21 MED ORDER — AZITHROMYCIN 500 MG PO TABS: 500.0000 mg | ORAL_TABLET | Freq: Every day | ORAL | 0 refills | Status: DC

## 2023-06-21 MED ORDER — CLENPIQ 10-3.5-12 MG-GM -GM/175ML PO SOLN: 1.0000 | ORAL | 0 refills | Status: DC

## 2023-06-21 NOTE — Patient Instructions (Signed)
We will schedule you for colonoscopy to evaluate your chronic colitis.  I am going to check blood work today at [facility] including CBC, CMP, vitamin D, B12, as well as an IBD panel which may give [place] more insight into your colitis.  I have sent in a 10-day prescription for azithromycin to have on hand for any flares.  It was very nice seeing you today.  Dr. Marletta Lor

## 2023-06-21 NOTE — Progress Notes (Signed)
Referring Provider: Raliegh Ip, DO Primary Care Physician:  Raliegh Ip, DO Primary GI:  Dr. Marletta Lor  Chief Complaint  Patient presents with   Follow-up    Follow up crohn's. Pt states so far everything is doing well    HPI:   Joyce Hopkins is a 62 y.o. female who presents to clinic today for follow up visit. Complicated GI history.   She has a history of abnormal LFTs in the setting of acute illness/gallbladder sludge.  Concern for beaded appearance of the bile ducts.  Seen at [facility], suspicion that abnormal appearance of bile ducts due to acute illness and sludge. Autoimmune markers were negative.  December 2019 with normal LFTs.  It was recommended to hold off on repeat MRI/MRCP unless new signs or symptoms.  Normal colonoscopy in 2012 at outside facility.    In Feb 2022, she had a GI pathogen panel detected C. difficile toxin A/B but stand-alone C. difficile toxin A+B was negative.  She was treated with 10-day course of vancomycin given her presentation.  Her diarrhea resolved.   In June 2023: GI pathogen panel was negative.  Celiac serologies negative.  LFTs normal.  CBC normal.   CT abdomen pelvis July 2023: Segmental Penning thickening of the colon, most prominent from the cecum through the splenic flexure consistent with inflammatory or infectious colitis.   Colonoscopy September 2023: -Terminal ileum appeared normal. - Non-bleeding internal hemorrhoids. - Four 3 to 4 mm polyps in the ascending colon and in the cecum, removed with a cold snare. Resected and retrieved. - One 2 mm polyp in the transverse colon, removed with a cold biopsy forceps. Resected and retrieved. - Moderate inflammation was found in the cecum secondary to colitis. Biopsied. - Patchy mild inflammation was found in the descending colon, in the transverse colon and in the ascending colon secondary to colitis. Biopsied. - The examined portion of the ileum was normal. - One 5 mm polyp in the  transverse colon, removed with a cold snare. Resected and retrieved. -Biopsies from cecum showed chronic active colitis with crypt abscesses, negative for granulomas and dysplasia -Biopsies from ascending, transverse colon show evidence of focally active colitis with cryptitis.  Descending colon biopsy showing focally active colitis.  Sigmoid and rectum biopsies negative. Polyps removed were inflammatory.  Started on Budesonide in an effort to put her in remission. Completed 3 month course.   Today, states she is doing well.  Having formed stools.  No melena hematochezia.  No abdominal pain.  No diarrhea.  Does note 2-3 flares over the last 12 months for which she takes a few days azithromycin and her symptoms resolved.  Past Medical History:  Diagnosis Date   Allergy    Biliary colic    GERD (gastroesophageal reflux disease)     Past Surgical History:  Procedure Laterality Date   BIOPSY  05/24/2022   Procedure: BIOPSY;  Surgeon: Lanelle Bal, DO;  [facility];  Service: Endoscopy;;   CHOLECYSTECTOMY N/A 07/19/2017   Procedure: LAPAROSCOPIC CHOLECYSTECTOMY WITH INTRAOPERATIVE CHOLANGIOGRAM;  Surgeon: Almond Lint, MD;  Location: [facility];  Service: General;  Laterality: N/A;   COLONOSCOPY WITH PROPOFOL N/A 05/24/2022   Procedure: COLONOSCOPY WITH PROPOFOL;  Surgeon: Lanelle Bal, DO;  [facility];  Service: Endoscopy;  Laterality: N/A;  7:30am, asa 2   POLYPECTOMY  05/24/2022   Procedure: POLYPECTOMY;  Surgeon: Lanelle Bal, DO;  [facility];  Service: Endoscopy;;  TMJ ARTHROPLASTY  1995    Current Outpatient Medications  Medication Sig Dispense Refill   albuterol (PROAIR HFA) 108 (90 Base) MCG/ACT inhaler Inhale 2 puffs into the lungs every 6 (six) hours as needed for wheezing or shortness of breath. 6.7 g 0   ALPRAZolam (XANAX) 0.5 MG tablet Take 1 tablet (0.5 mg total) by mouth at bedtime as needed for anxiety (travel). 30 tablet 1    cetirizine (ZYRTEC) 10 MG tablet Take 10 mg by mouth daily.     citalopram (CELEXA) 20 MG tablet Take 1 tablet (20 mg total) by mouth daily. **NEEDS TO BE SEEN BEFORE NEXT REFILL** 30 tablet 0   hyoscyamine (LEVSIN SL) 0.125 MG SL tablet Place 1 tablet (0.125 mg total) under the tongue every 4 (four) hours as needed. For cramping. Stop if constipation 30 tablet 0   No current facility-administered medications for this visit.    Allergies as of 06/21/2023 - Review Complete 06/21/2023  Allergen Reaction Noted   Tylenol [acetaminophen] Anaphylaxis and Other (See Comments) 02/22/2016   Levofloxacin Other (See Comments) 11/02/2010    Family History  Problem Relation Age of Onset   Heart disease Father    Stroke Neg Hx    Migraines Neg Hx    Neuropathy Neg Hx    Colon cancer Neg Hx    Pancreatic cancer Neg Hx    Celiac disease Neg Hx    Ulcers Neg Hx    Inflammatory bowel disease Neg Hx    Colon polyps Neg Hx    Breast cancer Neg Hx     Social History   Socioeconomic History   Marital status: Married    Spouse name: Gerri Spore "wes"   Number of children: 2   Years of education: 16   Highest education level: Not on file  Occupational History   Occupation: part-time  Tobacco Use   Smoking status: Never    Passive exposure: Never   Smokeless tobacco: Never  Vaping Use   Vaping status: Never Used  Substance and Sexual Activity   Alcohol use: Yes    Alcohol/week: 0.0 standard drinks of alcohol    Comment: a few times a week wine with supper   Drug use: No   Sexual activity: Yes  Other Topics Concern   Not on file  Social History Narrative   Lives with husband   Caffeine use: 1 code per day   Coffee daily (very little)   Social Determinants of Health   Financial Resource Strain: Not on file  Food Insecurity: Not on file  Transportation Needs: Not on file  Physical Activity: Not on file  Stress: Not on file  Social Connections: Not on file    Subjective: Review of  Systems  Constitutional:  Negative for chills and fever.  HENT:  Negative for congestion and hearing loss.   Eyes:  Negative for blurred vision and double vision.  Respiratory:  Negative for cough and shortness of breath.   Cardiovascular:  Negative for chest pain and palpitations.  Gastrointestinal:  Positive for diarrhea. Negative for abdominal pain, blood in stool, constipation, heartburn, melena and vomiting.  Genitourinary:  Negative for dysuria and urgency.  Musculoskeletal:  Negative for joint pain and myalgias.  Skin:  Negative for itching and rash.  Neurological:  Negative for dizziness and headaches.  Psychiatric/Behavioral:  Negative for depression. The patient is not nervous/anxious.      Objective: BP 117/77   Pulse 62   Temp (!) 97.5 F (36.4 C)  Ht 5\' 4"  (1.626 m)   Wt 112 lb 3.2 oz (50.9 kg)   BMI 19.26 kg/m  Physical Exam Constitutional:      Appearance: Normal appearance.  HENT:     Head: Normocephalic and atraumatic.  Eyes:     Extraocular Movements: Extraocular movements intact.     Conjunctiva/sclera: Conjunctivae normal.  Cardiovascular:     Rate and Rhythm: Normal rate and regular rhythm.  Pulmonary:     Effort: Pulmonary effort is normal.     Breath sounds: Normal breath sounds.  Abdominal:     General: Bowel sounds are normal.     Palpations: Abdomen is soft.  Musculoskeletal:        General: No swelling. Normal range of motion.     Cervical back: Normal range of motion and neck supple.  Skin:    General: Skin is warm and dry.     Coloration: Skin is not jaundiced.  Neurological:     General: No focal deficit present.     Mental Status: She is alert and oriented to person, place, and time.  Psychiatric:        Mood and Affect: Mood normal.        Behavior: Behavior normal.      Assessment: *Idiopathic inflammatory bowel disease-Crohn's colitis, mild *Abdominal pain-resolved *Diarrhea  Plan: Discussed patient's colonoscopy results  in depth with her today.  Given the chronicity of her symptoms and biopsy results, I believe patient likely has underlying Crohn's colitis.  No longer taking NSAIDs  Completed course of budesonide and appears clinically to be in remission.  Having formed stools.  No melena hematochezia.  No mucus in stools.  Continue to monitor for now.  Discussed high risk of relapse.  If relapse occurs, consider another course of budesonide with azathioprine combination and subsequent maintenance of azathioprine.  Would need TPMT testing prior to start.  May need to escalate therapy to biologic depending on her clinical course.  Discussed this in depth with her today as well.  Will schedule for colonoscopy today to evaluate disease activity. The risks including infection, bleed, or perforation as well as benefits, limitations, alternatives and imponderables have been reviewed with the patient. Questions have been answered. All parties agreeable.   Check blood work including CBC, CMP, B12, vitamin D.  Will also add on IBD panel which may help to confirm her diagnosis.   06/21/2023 11:08 AM   Disclaimer: This note was dictated with voice recognition software. Similar sounding words can inadvertently be transcribed and may not be corrected upon review.

## 2023-06-22 LAB — COMPREHENSIVE METABOLIC PANEL WITH GFR
ALT: 15 [IU]/L (ref 0–32)
AST: 20 [IU]/L (ref 0–40)
Albumin: 4.5 g/dL (ref 3.9–4.9)
Alkaline Phosphatase: 88 [IU]/L (ref 44–121)
BUN/Creatinine Ratio: 19 (ref 12–28)
BUN: 13 mg/dL (ref 8–27)
Bilirubin Total: 0.2 mg/dL (ref 0.0–1.2)
CO2: 24 mmol/L (ref 20–29)
Calcium: 9.6 mg/dL (ref 8.7–10.3)
Chloride: 100 mmol/L (ref 96–106)
Creatinine, Ser: 0.69 mg/dL (ref 0.57–1.00)
Globulin, Total: 2.4 g/dL (ref 1.5–4.5)
Glucose: 84 mg/dL (ref 70–99)
Potassium: 4.3 mmol/L (ref 3.5–5.2)
Sodium: 138 mmol/L (ref 134–144)
Total Protein: 6.9 g/dL (ref 6.0–8.5)
eGFR: 97 mL/min/{1.73_m2}

## 2023-06-22 LAB — VITAMIN D 25 HYDROXY (VIT D DEFICIENCY, FRACTURES): Vit D, 25-Hydroxy: 30.8 ng/mL (ref 30.0–100.0)

## 2023-06-22 LAB — CBC
Hematocrit: 41.1 % (ref 34.0–46.6)
Hemoglobin: 13.1 g/dL (ref 11.1–15.9)
MCH: 29.7 pg (ref 26.6–33.0)
MCHC: 31.9 g/dL (ref 31.5–35.7)
MCV: 93 fL (ref 79–97)
Platelets: 240 10*3/uL (ref 150–450)
RBC: 4.41 x10E6/uL (ref 3.77–5.28)
RDW: 13 % (ref 11.7–15.4)
WBC: 6 10*3/uL (ref 3.4–10.8)

## 2023-06-22 LAB — B12 AND FOLATE PANEL
Folate: 12.1 ng/mL (ref >3.0)
Vitamin B-12: 479 pg/mL (ref 232–1245)

## 2023-06-25 LAB — IBD EXPANDED PANEL
ACCA: 1 U (ref 0–90)
ALCA: 6 U (ref 0–60)
AMCA: 13 U (ref 0–100)
Atypical pANCA: NEGATIVE
gASCA: 14 U (ref 0–50)

## 2023-06-27 ENCOUNTER — Ambulatory Visit: Payer: 59 | Admitting: Family Medicine

## 2023-06-27 ENCOUNTER — Encounter: Payer: Self-pay | Admitting: Family Medicine

## 2023-06-27 ENCOUNTER — Other Ambulatory Visit: Payer: Self-pay

## 2023-06-27 ENCOUNTER — Other Ambulatory Visit (HOSPITAL_COMMUNITY): Payer: Self-pay

## 2023-06-27 VITALS — BP 105/63 | HR 81 | Temp 98.5°F | Ht 64.0 in | Wt 113.0 lb

## 2023-06-27 DIAGNOSIS — L570 Actinic keratosis: Secondary | ICD-10-CM

## 2023-06-27 DIAGNOSIS — F418 Other specified anxiety disorders: Secondary | ICD-10-CM | POA: Diagnosis not present

## 2023-06-27 DIAGNOSIS — Z79899 Other long term (current) drug therapy: Secondary | ICD-10-CM | POA: Diagnosis not present

## 2023-06-27 DIAGNOSIS — F411 Generalized anxiety disorder: Secondary | ICD-10-CM | POA: Diagnosis not present

## 2023-06-27 MED ORDER — CITALOPRAM HYDROBROMIDE 20 MG PO TABS
20.0000 mg | ORAL_TABLET | Freq: Every day | ORAL | 3 refills | Status: DC
  Filled 2023-06-27: qty 90, 90d supply, fill #0
  Filled 2023-09-28: qty 90, 90d supply, fill #1
  Filled 2023-12-27: qty 90, 90d supply, fill #2
  Filled 2024-03-24: qty 90, 90d supply, fill #3

## 2023-06-27 MED ORDER — ALPRAZOLAM 0.5 MG PO TABS
0.5000 mg | ORAL_TABLET | Freq: Every evening | ORAL | 1 refills | Status: DC | PRN
  Filled 2023-06-27: qty 30, 30d supply, fill #0

## 2023-06-27 NOTE — Patient Instructions (Signed)
Cryoablation, Care After The following information offers guidance on how to care for yourself after your procedure. Your health care provider may also give you more specific instructions. If you have problems or questions, contact your health care provider. What can I expect after the procedure? After the procedure, it is common to have: Redness or blisters near the area treated. Mild pain and swelling. Follow these instructions at home: Treatment area care  If you have an incision, follow instructions from your health care provider about how to take care of it. Make sure you: Wash your hands with soap and water for at least 20 seconds before and after you change your bandage (dressing). If soap and water are not available, use hand sanitizer. Change your dressing as told by your health care provider. Leave stitches (sutures), skin glue, or adhesive strips in place. These skin closures may need to stay in place for 2 weeks or longer. If adhesive strip edges start to loosen and curl up, you may trim the loose edges. Do not remove adhesive strips completely unless your health care provider tells you to do that. Check your treatment area every day for signs of infection. Check for: More redness, swelling, or pain. Fluid or blood. Warmth. Pus or a bad smell. Keep the treated area clean and dry. Keep it covered with a dressing until it has healed. Clean the area with soap and water as told by your health care provider. If your dressing gets wet, change it right away. Activity  Follow instructions from your health care provider about what activities are safe for you. You may have to avoid lifting. Ask your health care provider how much you can safely lift. If you were given a sedative during the procedure, it can affect you for several hours. Do not drive or operate machinery until your health care provider says that it is safe. General instructions Take over-the-counter and prescription  medicines only as told by your health care provider. Do not use any products that contain nicotine or tobacco. These products include cigarettes, chewing tobacco, and vaping devices, such as e-cigarettes. These can delay incision healing. If you need help quitting, ask your health care provider. Do not take baths, swim, or use a hot tub until your health care provider approves. Ask your health care provider if you may take showers. You may only be allowed to take sponge baths. Keep all follow-up visits. Your health care provider may need to check that treatment worked and that there were no problems caused by the procedure. Contact a health care provider if: You have more pain. You have a fever. You have nausea or vomiting. You have any signs of infection. You do not have a bowel movement for 2 days. You cannot urinate, or you cannot control when you urinate or have a bowel movement (have incontinence). You develop impotence. Get help right away if: You have severe pain. You have trouble swallowing or breathing. You are very weak or dizzy. You have chest pain or shortness of breath. These symptoms may be an emergency. Get help right away. Call 911. Do not wait to see if the symptoms will go away. Do not drive yourself to the hospital. This information is not intended to replace advice given to you by your health care provider. Make sure you discuss any questions you have with your health care provider. Document Revised: 02/10/2022 Document Reviewed: 02/10/2022 Elsevier Patient Education  2024 [employer].

## 2023-06-27 NOTE — Progress Notes (Signed)
Subjective: CC: Follow-up anxiety disorder PCP: Raliegh Ip, DO Joyce Hopkins is a 62 y.o. female presenting to clinic today for:  1.  Generalized anxiety disorder with panic attack Patient uses Celexa daily.  She reports no concerns or complaints with the medication.  Her symptoms are stable.  Continues to use alprazolam if needed for travel but uses this extremely rarely.  She still has several of her old supply but these should be expiring this year so would like to have one on hand if needed.  Denies any memory changes, excessive daytime sedation, falls, respiratory depression  2.  Skin lesion Patient reports a skin lesion on her left thigh that has been present for a while.  It is not colored.  She notes that it scabs over and then she picks at it off.  It is irritating.   ROS: Per HPI  Allergies  Allergen Reactions   Tylenol [Acetaminophen] Anaphylaxis and Other (See Comments)    Wheezing/hives   Levofloxacin Other (See Comments)    Muscle spasms   Past Medical History:  Diagnosis Date   Allergy    Biliary colic    GERD (gastroesophageal reflux disease)     Current Outpatient Medications:    albuterol (PROAIR HFA) 108 (90 Base) MCG/ACT inhaler, Inhale 2 puffs into the lungs every 6 (six) hours as needed for wheezing or shortness of breath., Disp: 6.7 g, Rfl: 0   cetirizine (ZYRTEC) 10 MG tablet, Take 10 mg by mouth daily., Disp: , Rfl:    hyoscyamine (LEVSIN SL) 0.125 MG SL tablet, Place 1 tablet (0.125 mg total) under the tongue every 4 (four) hours as needed. For cramping. Stop if constipation, Disp: 30 tablet, Rfl: 0   Sod Picosulfate-Mag Ox-Cit Acd (CLENPIQ) 10-3.5-12 MG-GM -GM/175ML SOLN, Take 1 kit by mouth as directed., Disp: 350 mL, Rfl: 0   ALPRAZolam (XANAX) 0.5 MG tablet, Take 1 tablet (0.5 mg total) by mouth at bedtime as needed for anxiety (travel)., Disp: 30 tablet, Rfl: 1   citalopram (CELEXA) 20 MG tablet, Take 1 tablet (20 mg total) by mouth  daily., Disp: 90 tablet, Rfl: 3 Social History   Socioeconomic History   Marital status: Married    Spouse name: Gerri Spore "wes"   Number of children: 2   Years of education: 16   Highest education level: Bachelor's degree (e.g., BA, AB, BS)  Occupational History   Occupation: part-time  Tobacco Use   Smoking status: Never    Passive exposure: Never   Smokeless tobacco: Never  Vaping Use   Vaping status: Never Used  Substance and Sexual Activity   Alcohol use: Yes    Alcohol/week: 0.0 standard drinks of alcohol    Comment: a few times a week wine with supper   Drug use: No   Sexual activity: Yes  Other Topics Concern   Not on file  Social History Narrative   Lives with husband   Caffeine use: 1 code per day   Coffee daily (very little)   Social Determinants of Health   Financial Resource Strain: Patient Declined (06/26/2023)   Overall Financial Resource Strain (CARDIA)    Difficulty of Paying Living Expenses: Patient declined  Food Insecurity: Patient Declined (06/26/2023)   Hunger Vital Sign    Worried About Running Out of Food in the Last Year: Patient declined    Ran Out of Food in the Last Year: Patient declined  Transportation Needs: Patient Declined (06/26/2023)   PRAPARE - Transportation  Lack of Transportation (Medical): Patient declined    Lack of Transportation (Non-Medical): Patient declined  Physical Activity: Sufficiently Active (06/26/2023)   Exercise Vital Sign    Days of Exercise per Week: 6 days    Minutes of Exercise per Session: 120 min  Stress: No Stress Concern Present (06/26/2023)   [employer] of Occupational Health - Occupational Stress Questionnaire    Feeling of Stress : Only a little  Social Connections: Unknown (06/26/2023)   Social Connection and Isolation Panel [NHANES]    Frequency of Communication with Friends and Family: Patient declined    Frequency of Social Gatherings with Friends and Family: Patient declined    Attends  Religious Services: Patient declined    Database administrator or Organizations: Patient declined    Attends Engineer, structural: Not on file    Marital Status: Married  Catering manager Violence: Not on file   Family History  Problem Relation Age of Onset   Heart disease Father    Stroke Neg Hx    Migraines Neg Hx    Neuropathy Neg Hx    Colon cancer Neg Hx    Pancreatic cancer Neg Hx    Celiac disease Neg Hx    Ulcers Neg Hx    Inflammatory bowel disease Neg Hx    Colon polyps Neg Hx    Breast cancer Neg Hx     Objective: Office vital signs reviewed. BP 105/63   Pulse 81   Temp 98.5 F (36.9 C)   Ht 5\' 4"  (1.626 m)   Wt 113 lb (51.3 kg)   SpO2 93%   BMI 19.40 kg/m   Physical Examination:  General: Awake, alert, well nourished, No acute distress HEENT: sclera white, MMM Cardio: regular rate and rhythm, S1S2 heard, no murmurs appreciated Pulm: clear to auscultation bilaterally, no wheezes, rhonchi or rales; normal work of breathing on room air Skin: Flesh colored hyperkeratotic lesion noted along the left lateral thigh that is approximately 3-1/2 mm x 3 and half millimeters in size.  No hypervascularity or pigment appreciated     06/27/2023    8:49 AM 07/05/2022   10:26 AM 03/17/2022   10:28 AM  Depression screen PHQ 2/9  Decreased Interest 0 0 0  Down, Depressed, Hopeless 0 0 0  PHQ - 2 Score 0 0 0  Altered sleeping 0 0   Tired, decreased energy 0 0   Change in appetite 0 0   Feeling bad or failure about yourself  0 0   Trouble concentrating 0 0   Moving slowly or fidgety/restless 0 0   Suicidal thoughts 0 0   PHQ-9 Score 0 0   Difficult doing work/chores Not difficult at all Not difficult at all       06/27/2023    8:47 AM 07/05/2022   10:26 AM 03/17/2022   10:28 AM 12/20/2021   12:20 PM  GAD 7 : Generalized Anxiety Score  Nervous, Anxious, on Edge 0 0 0 0  Control/stop worrying 0 0 0 0  Worry too much - different things 0 0 0 0  Trouble  relaxing 0 0 0 0  Restless 0 0 0 0  Easily annoyed or irritable 0 0 0 0  Afraid - awful might happen 0 0 0 0  Total GAD 7 Score 0 0 0 0  Anxiety Difficulty  Not difficult at all Not difficult at all Not difficult at all    Assessment/ Plan: 62 y.o. female  Generalized anxiety disorder - Plan: citalopram (CELEXA) 20 MG tablet, Drug Screen 10 W/Conf, Se, CANCELED: ToxASSURE Select 13 (MW), Urine  Situational anxiety - Plan: ALPRAZolam (XANAX) 0.5 MG tablet, Drug Screen 10 W/Conf, Se, CANCELED: ToxASSURE Select 13 (MW), Urine  Controlled substance agreement signed - Plan: Drug Screen 10 W/Conf, Se, CANCELED: ToxASSURE Select 13 (MW), Urine  Actinic keratosis  Could not void will obtain drug screen through blood.  The national narcotic database reviewed and there were no red flags.  Medications have been renewed for the patient and she may follow-up for annual physical  Actinic keratosis treated with cryoablation today.  She tolerated procedure without difficulty and handout will be provided with further instructions for care.   Raliegh Ip, DO [facility] [phone]

## 2023-06-28 ENCOUNTER — Other Ambulatory Visit (HOSPITAL_COMMUNITY): Payer: Self-pay

## 2023-07-01 LAB — DRUG SCREEN 10 W/CONF, SERUM
Amphetamines, IA: NEGATIVE ng/mL
Barbiturates, IA: NEGATIVE ug/mL
Benzodiazepines, IA: NEGATIVE ng/mL
Cocaine & Metabolite, IA: NEGATIVE ng/mL
Methadone, IA: NEGATIVE ng/mL
Opiates, IA: NEGATIVE ng/mL
Oxycodones, IA: NEGATIVE ng/mL
Phencyclidine, IA: NEGATIVE ng/mL
Propoxyphene, IA: NEGATIVE ng/mL
THC(Marijuana) Metabolite, IA: NEGATIVE ng/mL

## 2023-07-31 ENCOUNTER — Ambulatory Visit (HOSPITAL_COMMUNITY): Payer: 59 | Admitting: Anesthesiology

## 2023-07-31 ENCOUNTER — Ambulatory Visit (HOSPITAL_COMMUNITY)
Admission: RE | Admit: 2023-07-31 | Discharge: 2023-07-31 | Disposition: A | Discharge status: Home / Self Care | Payer: 59 | Attending: Internal Medicine | Admitting: Internal Medicine

## 2023-07-31 ENCOUNTER — Other Ambulatory Visit: Payer: Self-pay

## 2023-07-31 ENCOUNTER — Encounter (HOSPITAL_COMMUNITY): Payer: Self-pay

## 2023-07-31 ENCOUNTER — Encounter (HOSPITAL_COMMUNITY)
Admission: RE | Disposition: A | Discharge status: Home / Self Care | Payer: Self-pay | Source: Home / Self Care | Attending: Internal Medicine

## 2023-07-31 DIAGNOSIS — F419 Anxiety disorder, unspecified: Secondary | ICD-10-CM | POA: Diagnosis not present

## 2023-07-31 DIAGNOSIS — K529 Noninfective gastroenteritis and colitis, unspecified: Secondary | ICD-10-CM

## 2023-07-31 DIAGNOSIS — K5010 Crohn's disease of large intestine without complications: Secondary | ICD-10-CM | POA: Insufficient documentation

## 2023-07-31 DIAGNOSIS — J45909 Unspecified asthma, uncomplicated: Secondary | ICD-10-CM | POA: Diagnosis not present

## 2023-07-31 DIAGNOSIS — K648 Other hemorrhoids: Secondary | ICD-10-CM

## 2023-07-31 DIAGNOSIS — K635 Polyp of colon: Secondary | ICD-10-CM | POA: Diagnosis not present

## 2023-07-31 DIAGNOSIS — K6389 Other specified diseases of intestine: Secondary | ICD-10-CM | POA: Diagnosis not present

## 2023-07-31 HISTORY — PX: BIOPSY: SHX5522

## 2023-07-31 HISTORY — PX: POLYPECTOMY: SHX5525

## 2023-07-31 HISTORY — PX: COLONOSCOPY WITH PROPOFOL: SHX5780

## 2023-07-31 SURGERY — COLONOSCOPY WITH PROPOFOL
Anesthesia: General

## 2023-07-31 MED ORDER — LACTATED RINGERS IV SOLN: INTRAVENOUS | Status: DC | PRN

## 2023-07-31 MED ORDER — LACTATED RINGERS IV SOLN: INTRAVENOUS | Status: DC

## 2023-07-31 MED ORDER — PROPOFOL 10 MG/ML IV BOLUS
INTRAVENOUS | Status: DC | PRN
  Administered 2023-07-31 (×2): 30 mg via INTRAVENOUS
  Administered 2023-07-31: 40 mg via INTRAVENOUS
  Administered 2023-07-31: 30 mg via INTRAVENOUS
  Administered 2023-07-31: 40 mg via INTRAVENOUS
  Administered 2023-07-31: 80 mg via INTRAVENOUS

## 2023-07-31 NOTE — Transfer of Care (Signed)
Immediate Anesthesia Transfer of Care Note  Patient: Joyce Hopkins  Procedure(s) Performed: COLONOSCOPY WITH PROPOFOL BIOPSY POLYPECTOMY  Patient Location: Endoscopy Unit  Anesthesia Type:General  Level of Consciousness: awake, alert , and oriented  Airway & Oxygen Therapy: Patient Spontanous Breathing  Post-op Assessment: Report given to RN and Post -op Vital signs reviewed and stable  Post vital signs: Reviewed and stable  Last Vitals:  Vitals Value Taken Time  BP 85/53 07/31/23 0938  Temp 36.4 C 07/31/23 0936  Pulse 77 07/31/23 0938  Resp 19 07/31/23 0936  SpO2 97 % 07/31/23 0938    Last Pain:  Vitals:   07/31/23 0938  TempSrc:   PainSc: 0-No pain      Patients Stated Pain Goal: 7 (07/31/23 0813)  Complications: No notable events documented.

## 2023-07-31 NOTE — Anesthesia Postprocedure Evaluation (Signed)
Anesthesia Post Note  Patient: Joyce Hopkins  Procedure(s) Performed: COLONOSCOPY WITH PROPOFOL BIOPSY POLYPECTOMY  Patient location during evaluation: Phase II Anesthesia Type: General Level of consciousness: awake Pain management: pain level controlled Vital Signs Assessment: post-procedure vital signs reviewed and stable Respiratory status: spontaneous breathing and respiratory function stable Cardiovascular status: blood pressure returned to baseline and stable Postop Assessment: no headache and no apparent nausea or vomiting Anesthetic complications: no Comments: Late entry   No notable events documented.   Last Vitals:  Vitals:   07/31/23 0936 07/31/23 0938  BP: (!) 75/46 (!) 85/53  Pulse: 75 77  Resp: 19   Temp: 36.4 C   SpO2: 97% 97%    Last Pain:  Vitals:   07/31/23 0938  TempSrc:   PainSc: 0-No pain                 Windell Norfolk

## 2023-07-31 NOTE — Discharge Instructions (Addendum)
  Colonoscopy Discharge Instructions  Read the instructions outlined below and refer to this sheet in the next few weeks. These discharge instructions provide you with general information on caring for yourself after you leave the hospital. Your doctor may also give you specific instructions. While your treatment has been planned according to the most current medical practices available, unavoidable complications occasionally occur.   ACTIVITY You may resume your regular activity, but move at a slower pace for the next 24 hours.  Take frequent rest periods for the next 24 hours.  Walking will help get rid of the air and reduce the bloated feeling in your belly (abdomen).  No driving for 24 hours (because of the medicine (anesthesia) used during the test).   Do not sign any important legal documents or operate any machinery for 24 hours (because of the anesthesia used during the test).  NUTRITION Drink plenty of fluids.  You may resume your normal diet as instructed by your doctor.  Begin with a light meal and progress to your normal diet. Heavy or fried foods are harder to digest and may make you feel sick to your stomach (nauseated).  Avoid alcoholic beverages for 24 hours or as instructed.  MEDICATIONS You may resume your normal medications unless your doctor tells you otherwise.  WHAT YOU CAN EXPECT TODAY Some feelings of bloating in the abdomen.  Passage of more gas than usual.  Spotting of blood in your stool or on the toilet paper.  IF YOU HAD POLYPS REMOVED DURING THE COLONOSCOPY: No aspirin products for 7 days or as instructed.  No alcohol for 7 days or as instructed.  Eat a soft diet for the next 24 hours.  FINDING OUT THE RESULTS OF YOUR TEST Not all test results are available during your visit. If your test results are not back during the visit, make an appointment with your caregiver to find out the results. Do not assume everything is normal if you have not heard from your  caregiver or the medical facility. It is important for you to follow up on all of your test results.  SEEK IMMEDIATE MEDICAL ATTENTION IF: You have more than a spotting of blood in your stool.  Your belly is swollen (abdominal distention).  You are nauseated or vomiting.  You have a temperature over 101.  You have abdominal pain or discomfort that is severe or gets worse throughout the day.   Your colonoscopy again revealed inflammation in the majority of your colon, left side of colon spared.  I took biopsies throughout your entire colon.  Few small polyps removed.  We will call with these results.  I think at this point we likely need to escalate therapy for underlying Crohn's colitis.  If biopsies confirm chronic colitis, we will pursue blood work including TB testing, viral hepatitis, as well as a baseline fecal calprotectin.  Follow-up in 3 to 4 weeks.  OFFICE WILL CALL WITH APPOINTMENT  I hope you have a great rest of your week!  Hennie Duos. Marletta Lor, D.O. Gastroenterology and Hepatology [facility]

## 2023-07-31 NOTE — H&P (Signed)
Primary Care Physician:  Raliegh Ip, DO Primary Gastroenterologist:  Dr. Marletta Lor  Pre-Procedure History & Physical: HPI:  Joyce Hopkins is a 62 y.o. female is here for a colonoscopy for colitis, ?crohn's  Past Medical History:  Diagnosis Date   Allergy    Biliary colic    GERD (gastroesophageal reflux disease)     Past Surgical History:  Procedure Laterality Date   BIOPSY  05/24/2022   Procedure: BIOPSY;  Surgeon: Lanelle Bal, DO;  Location: [facility] ENDO SUITE;  Service: Endoscopy;;   CHOLECYSTECTOMY N/A 07/19/2017   Procedure: LAPAROSCOPIC CHOLECYSTECTOMY WITH INTRAOPERATIVE CHOLANGIOGRAM;  Surgeon: Almond Lint, MD;  Location: [facility] OR;  Service: General;  Laterality: N/A;   COLONOSCOPY WITH PROPOFOL N/A 05/24/2022   Procedure: COLONOSCOPY WITH PROPOFOL;  Surgeon: Lanelle Bal, DO;  Location: [facility];  Service: Endoscopy;  Laterality: N/A;  7:30am, asa 2   POLYPECTOMY  05/24/2022   Procedure: POLYPECTOMY;  Surgeon: Lanelle Bal, DO;  Location: [facility];  Service: Endoscopy;;   TMJ ARTHROPLASTY  1995    Prior to Admission medications   Medication Sig Start Date End Date Taking? Authorizing Provider  ALPRAZolam Prudy Feeler) 0.5 MG tablet Take 1 tablet (0.5 mg total) by mouth at bedtime as needed for anxiety (travel). 06/27/23  Yes Gottschalk, Kathie Rhodes M, DO  cetirizine (ZYRTEC) 10 MG tablet Take 10 mg by mouth daily.   Yes [provider]  citalopram (CELEXA) 20 MG tablet Take 1 tablet (20 mg total) by mouth daily. 06/27/23  Yes Gottschalk, Ashly M, DO  albuterol (PROAIR HFA) 108 (90 Base) MCG/ACT inhaler Inhale 2 puffs into the lungs every 6 (six) hours as needed for wheezing or shortness of breath. 02/27/22   Raliegh Ip, DO  hyoscyamine (LEVSIN SL) 0.125 MG SL tablet Place 1 tablet (0.125 mg total) under the tongue every 4 (four) hours as needed. For cramping. Stop if constipation 10/27/20   Gelene Mink, NP  Sod Picosulfate-Mag Ox-Cit Acd (CLENPIQ)  10-3.5-12 MG-GM -GM/175ML SOLN Take 1 kit by mouth as directed. 06/21/23   Lanelle Bal, DO    Allergies as of 06/21/2023 - Review Complete 06/21/2023  Allergen Reaction Noted   Tylenol [acetaminophen] Anaphylaxis and Other (See Comments) 02/22/2016   Levofloxacin Other (See Comments) 11/02/2010    Family History  Problem Relation Age of Onset   Heart disease Father    Stroke Neg Hx    Migraines Neg Hx    Neuropathy Neg Hx    Colon cancer Neg Hx    Pancreatic cancer Neg Hx    Celiac disease Neg Hx    Ulcers Neg Hx    Inflammatory bowel disease Neg Hx    Colon polyps Neg Hx    Breast cancer Neg Hx     Social History   Socioeconomic History   Marital status: Married    Spouse name: Gerri Spore "wes"   Number of children: 2   Years of education: 16   Highest education level: Bachelor's degree (e.g., BA, AB, BS)  Occupational History   Occupation: part-time  Tobacco Use   Smoking status: Never    Passive exposure: Never   Smokeless tobacco: Never  Vaping Use   Vaping status: Never Used  Substance and Sexual Activity   Alcohol use: Yes    Alcohol/week: 0.0 standard drinks of alcohol    Comment: a few times a week wine with supper   Drug use: No   Sexual activity: Yes  Other  Topics Concern   Not on file  Social History Narrative   Lives with husband   Caffeine use: 1 code per day   Coffee daily (very little)   Social Determinants of Health   Financial Resource Strain: Patient Declined (06/26/2023)   Overall Financial Resource Strain (CARDIA)    Difficulty of Paying Living Expenses: Patient declined  Food Insecurity: Patient Declined (06/26/2023)   Hunger Vital Sign    Worried About Running Out of Food in the Last Year: Patient declined    Ran Out of Food in the Last Year: Patient declined  Transportation Needs: Patient Declined (06/26/2023)   PRAPARE - Administrator, Civil Service (Medical): Patient declined    Lack of Transportation  (Non-Medical): Patient declined  Physical Activity: Sufficiently Active (06/26/2023)   Exercise Vital Sign    Days of Exercise per Week: 6 days    Minutes of Exercise per Session: 120 min  Stress: No Stress Concern Present (06/26/2023)   [employer] of Occupational Health - Occupational Stress Questionnaire    Feeling of Stress : Only a little  Social Connections: Unknown (06/26/2023)   Social Connection and Isolation Panel [NHANES]    Frequency of Communication with Friends and Family: Patient declined    Frequency of Social Gatherings with Friends and Family: Patient declined    Attends Religious Services: Patient declined    Database administrator or Organizations: Patient declined    Attends Engineer, structural: Not on file    Marital Status: Married  Catering manager Violence: Not on file    Review of Systems: See HPI, otherwise negative ROS  Physical Exam: Vital signs in last 24 hours: Temp:  [97.9 F (36.6 C)] 97.9 F (36.6 C) (11/19 0823) Pulse Rate:  [82] 82 (11/19 0823) Resp:  [18] 18 (11/19 0823) BP: (112)/(61) 112/61 (11/19 0823) SpO2:  [100 %] 100 % (11/19 0823) Weight:  [49 kg] 49 kg (11/19 0813)   General:   Alert,  Well-developed, well-nourished, pleasant and cooperative in NAD Head:  Normocephalic and atraumatic. Eyes:  Sclera clear, no icterus.   Conjunctiva pink. Ears:  Normal auditory acuity. Nose:  No deformity, discharge,  or lesions. Msk:  Symmetrical without gross deformities. Normal posture. Extremities:  Without clubbing or edema. Neurologic:  Alert and  oriented x4;  grossly normal neurologically. Skin:  Intact without significant lesions or rashes. Psych:  Alert and cooperative. Normal mood and affect.  Impression/Plan: [name] is here for a colonoscopy to be performed for colitis, ?crohn's  The risks of the procedure including infection, bleed, or perforation as well as benefits, limitations, alternatives and imponderables  have been reviewed with the patient. Questions have been answered. All parties agreeable.

## 2023-07-31 NOTE — Op Note (Signed)
[facility] Patient Name: Nathifa Czarnota Procedure Date: 07/31/2023 9:05 AM MRN: [id number] Date of Birth: [birth year] Attending MD: Hennie Duos. Marletta Lor , [place], [id number] CSN: [id number] Age: 62 Admit Type: Outpatient Procedure:                Colonoscopy Indications:              Follow-up of colitis Providers:                Hennie Duos. Marletta Lor, DO, Buel Ream. Thomasena Edis RN, RN,                            Elinor Parkinson Referring MD:              Medicines:                See the Anesthesia note for documentation of the                            administered medications Complications:            No immediate complications. Estimated Blood Loss:     Estimated blood loss was minimal. Procedure:                Pre-Anesthesia Assessment:                           - The anesthesia plan was to use monitored                            anesthesia care (MAC).                           After obtaining informed consent, the colonoscope                            was passed under direct vision. Throughout the                            procedure, the patient's blood pressure, pulse, and                            oxygen saturations were monitored continuously. The                            PCF-HQ190L ([id number]) scope was introduced through                            the anus and advanced to the the terminal ileum,                            with identification of the appendiceal orifice and                            IC valve. The colonoscopy was performed without                            difficulty. The patient tolerated the  procedure                            well. The quality of the bowel preparation was                            evaluated using the BBPS [facility] Bowel Preparation                            Scale) with scores of: Right Colon = 3, Transverse                            Colon = 3 and Left Colon = 3 (entire mucosa seen                            well with no residual staining,  small fragments of                            stool or opaque liquid). The total BBPS score                            equals 9. Scope In: 9:18:18 AM Scope Out: 9:33:19 AM Scope Withdrawal Time: 0 hours 12 minutes 43 seconds  Total Procedure Duration: 0 hours 15 minutes 1 second  Findings:      Non-bleeding internal hemorrhoids were found during endoscopy.      The terminal ileum appeared normal. Biopsies were taken with a cold       forceps for histology.      Mild inflammation characterized by erosions and erythema was found in       the descending colon, in the transverse colon, in the ascending colon       and in the cecum. Segmental biopsies were taken with a cold forceps for       histology. Rectum and sigmoid colon spared of inflammation      Two sessile polyps were found in the transverse colon. The polyps were 3       to 5 mm in size. These polyps were removed with a cold snare. Resection       and retrieval were complete. Impression:               - Non-bleeding internal hemorrhoids.                           - The examined portion of the ileum was normal.                            Biopsied.                           - Mild inflammation was found in the descending                            colon, in the transverse colon, in the ascending  colon and in the cecum secondary to colitis.                            Biopsied.                           - Two 3 to 5 mm polyps in the transverse colon,                            removed with a cold snare. Resected and retrieved. Moderate Sedation:      Per Anesthesia Care Recommendation:           - Patient has a contact number available for                            emergencies. The signs and symptoms of potential                            delayed complications were discussed with the                            patient. Return to normal activities tomorrow.                            Written discharge  instructions were provided to the                            patient.                           - Resume previous diet.                           - Continue present medications.                           - Await pathology results.                           - Return to GI clinic in 4 weeks.                           - Check Quantiferon, viral hepatitis in                            anticipation of likely biologic therpay. Will also                            check baseline fecal calprotectin Procedure Code(s):        --- Professional ---                           [id number], Colonoscopy, flexible; with removal of                            tumor(s), polyp(s), or other lesion(s) by snare  technique                           [id number], 59, Colonoscopy, flexible; with biopsy,                            single or multiple Diagnosis Code(s):        --- Professional ---                           K64.8, Other hemorrhoids                           K52.9, Noninfective gastroenteritis and colitis,                            unspecified                           D12.3, Benign neoplasm of transverse colon (hepatic                            flexure or splenic flexure) CPT copyright 2022 American Medical Association. All rights reserved. The codes documented in this report are preliminary and upon coder review may  be revised to meet current compliance requirements. Hennie Duos. Marletta Lor, DO Hennie Duos. Marletta Lor, DO 07/31/2023 9:40:26 AM This report has been signed electronically. Number of Addenda: 0

## 2023-07-31 NOTE — Anesthesia Preprocedure Evaluation (Signed)
Anesthesia Evaluation  Patient identified by MRN, date of birth, ID band Patient awake    Reviewed: Allergy & Precautions, H&P , NPO status , Patient's Chart, lab work & pertinent test results, reviewed documented beta blocker date and time   Airway Mallampati: II  TM Distance: >3 FB Neck ROM: full    Dental no notable dental hx.    Pulmonary neg pulmonary ROS, asthma    Pulmonary exam normal breath sounds clear to auscultation       Cardiovascular Exercise Tolerance: Good negative cardio ROS  Rhythm:regular Rate:Normal     Neuro/Psych  PSYCHIATRIC DISORDERS Anxiety     negative neurological ROS  negative psych ROS   GI/Hepatic negative GI ROS, Neg liver ROS,GERD  ,,  Endo/Other  negative endocrine ROS    Renal/GU negative Renal ROS  negative genitourinary   Musculoskeletal   Abdominal   Peds  Hematology negative hematology ROS (+)   Anesthesia Other Findings   Reproductive/Obstetrics negative OB ROS                             Anesthesia Physical Anesthesia Plan  ASA: 2  Anesthesia Plan: General   Post-op Pain Management:    Induction:   PONV Risk Score and Plan: Propofol infusion  Airway Management Planned:   Additional Equipment:   Intra-op Plan:   Post-operative Plan:   Informed Consent: I have reviewed the patients History and Physical, chart, labs and discussed the procedure including the risks, benefits and alternatives for the proposed anesthesia with the patient or authorized representative who has indicated his/her understanding and acceptance.     Dental Advisory Given  Plan Discussed with: CRNA  Anesthesia Plan Comments:        Anesthesia Quick Evaluation

## 2023-08-01 ENCOUNTER — Encounter (INDEPENDENT_AMBULATORY_CARE_PROVIDER_SITE_OTHER): Payer: Self-pay

## 2023-08-01 ENCOUNTER — Other Ambulatory Visit: Payer: Self-pay | Admitting: Internal Medicine

## 2023-08-01 DIAGNOSIS — K5010 Crohn's disease of large intestine without complications: Secondary | ICD-10-CM

## 2023-08-01 LAB — SURGICAL PATHOLOGY

## 2023-08-03 DIAGNOSIS — K5010 Crohn's disease of large intestine without complications: Secondary | ICD-10-CM | POA: Diagnosis not present

## 2023-08-06 ENCOUNTER — Encounter (HOSPITAL_COMMUNITY): Payer: Self-pay | Admitting: Internal Medicine

## 2023-08-07 LAB — ACUTE VIRAL HEPATITIS (HAV, HBV, HCV)
HCV Ab: NONREACTIVE
Hep A IgM: NEGATIVE
Hep B C IgM: NEGATIVE
Hepatitis B Surface Ag: NEGATIVE

## 2023-08-07 LAB — HCV INTERPRETATION

## 2023-08-07 LAB — QUANTIFERON-TB GOLD PLUS
QuantiFERON Mitogen Value: >10 [IU]/mL
QuantiFERON Nil Value: 0 [IU]/mL
QuantiFERON TB1 Ag Value: 0.02 [IU]/mL
QuantiFERON TB2 Ag Value: 0.01 [IU]/mL
QuantiFERON-TB Gold Plus: NEGATIVE

## 2023-08-07 LAB — HEPATITIS B SURFACE ANTIBODY,QUALITATIVE: Hep B Surface Ab, Qual: REACTIVE

## 2023-08-07 LAB — HEPATITIS A ANTIBODY, TOTAL: hep A Total Ab: POSITIVE — AB

## 2023-08-08 NOTE — Addendum Note (Signed)
Addended by: Zada Finders on: 08/08/2023 04:11 PM   Modules accepted: Orders

## 2023-08-11 LAB — CALPROTECTIN, FECAL: Calprotectin, Fecal: 263 ug/g — ABNORMAL HIGH (ref 0–120)

## 2023-08-24 ENCOUNTER — Encounter: Payer: Self-pay | Admitting: Family Medicine

## 2023-08-24 ENCOUNTER — Other Ambulatory Visit (HOSPITAL_COMMUNITY): Payer: Self-pay

## 2023-08-24 ENCOUNTER — Ambulatory Visit (INDEPENDENT_AMBULATORY_CARE_PROVIDER_SITE_OTHER): Payer: 59 | Admitting: Family Medicine

## 2023-08-24 VITALS — BP 117/67 | HR 102 | Temp 98.8°F | Ht 64.0 in | Wt 114.0 lb

## 2023-08-24 DIAGNOSIS — K5010 Crohn's disease of large intestine without complications: Secondary | ICD-10-CM | POA: Diagnosis not present

## 2023-08-24 DIAGNOSIS — Z0001 Encounter for general adult medical examination with abnormal findings: Secondary | ICD-10-CM | POA: Diagnosis not present

## 2023-08-24 DIAGNOSIS — J9801 Acute bronchospasm: Secondary | ICD-10-CM | POA: Diagnosis not present

## 2023-08-24 DIAGNOSIS — E7800 Pure hypercholesterolemia, unspecified: Secondary | ICD-10-CM | POA: Diagnosis not present

## 2023-08-24 DIAGNOSIS — Z79899 Other long term (current) drug therapy: Secondary | ICD-10-CM | POA: Diagnosis not present

## 2023-08-24 DIAGNOSIS — Z0000 Encounter for general adult medical examination without abnormal findings: Secondary | ICD-10-CM

## 2023-08-24 MED ORDER — ALBUTEROL SULFATE HFA 108 (90 BASE) MCG/ACT IN AERS
2.0000 | INHALATION_SPRAY | Freq: Four times a day (QID) | RESPIRATORY_TRACT | 0 refills | Status: DC | PRN
  Filled 2023-08-24: qty 6.7, 25d supply, fill #0

## 2023-08-24 NOTE — Patient Instructions (Signed)
If needing refill on xanax before your next physical, ok to schedule video visit if needed since we have an up to date contract/ drug screening.  Have a WONDERFUL time with your family!  See you next year.

## 2023-08-24 NOTE — Progress Notes (Signed)
Joyce Hopkins is a 62 y.o. female presents to office today for annual physical exam examination.    Concerns today include: 1. None. Doing well  Marital status: married, Substance use: none There are no preventive care reminders to display for this patient. Refills needed today: none  Immunization History  Administered Date(s) Administered   Hepatitis A, Adult 05/07/2018, 11/07/2018   Influenza Inj Mdck Quad Pf 06/07/2018   Influenza Split 07/10/2013   Influenza,inj,Quad PF,6+ Mos 06/24/2016, 06/05/2017, 06/08/2020, 06/23/2021, 07/05/2022   Influenza,trivalent, recombinat, inj, PF 07/10/2013   Influenza-Unspecified 06/05/2017, 06/07/2018, 05/04/2023   MODERNA COVID-19 SARS-COV-2 PEDS BIVALENT BOOSTER 67yr-23yr 07/25/2022   Moderna Sars-Covid-2 Vaccination 12/05/2019, 01/07/2020   Tdap 01/09/2006, 06/23/2021   Zoster Recombinant(Shingrix) 06/08/2020, 08/27/2020   Past Medical History:  Diagnosis Date   Allergy    Biliary colic    GERD (gastroesophageal reflux disease)    Social History   Socioeconomic History   Marital status: Married    Spouse name: Gerri Spore "wes"   Number of children: 2   Years of education: 16   Highest education level: Bachelor's degree (e.g., BA, AB, BS)  Occupational History   Occupation: part-time  Tobacco Use   Smoking status: Never    Passive exposure: Never   Smokeless tobacco: Never  Vaping Use   Vaping status: Never Used  Substance and Sexual Activity   Alcohol use: Yes    Alcohol/week: 0.0 standard drinks of alcohol    Comment: a few times a week wine with supper   Drug use: No   Sexual activity: Yes  Other Topics Concern   Not on file  Social History Narrative   Lives with husband   Caffeine use: 1 code per day   Coffee daily (very little)   Social Drivers of Corporate investment banker Strain: Low Risk  (08/23/2023)   Overall Financial Resource Strain (CARDIA)    Difficulty of Paying Living Expenses: Not very hard  Food  Insecurity: No Food Insecurity (08/23/2023)   Hunger Vital Sign    Worried About Running Out of Food in the Last Year: Never true    Ran Out of Food in the Last Year: Never true  Transportation Needs: No Transportation Needs (08/23/2023)   PRAPARE - Administrator, Civil Service (Medical): No    Lack of Transportation (Non-Medical): No  Physical Activity: Sufficiently Active (08/23/2023)   Exercise Vital Sign    Days of Exercise per Week: 5 days    Minutes of Exercise per Session: 90 min  Stress: No Stress Concern Present (08/23/2023)   [employer] of Occupational Health - Occupational Stress Questionnaire    Feeling of Stress : Only a little  Social Connections: Socially Integrated (08/23/2023)   Social Connection and Isolation Panel [NHANES]    Frequency of Communication with Friends and Family: Three times a week    Frequency of Social Gatherings with Friends and Family: Twice a week    Attends Religious Services: More than 4 times per year    Active Member of Clubs or Organizations: Yes    Attends Banker Meetings: More than 4 times per year    Marital Status: Married  Catering manager Violence: Not on file   Past Surgical History:  Procedure Laterality Date   BIOPSY  05/24/2022   Procedure: BIOPSY;  Surgeon: Lanelle Bal, DO;  [facility];  Service: Endoscopy;;   BIOPSY  07/31/2023   Procedure: BIOPSY;  Surgeon: Lanelle Bal,  DO;  [facility];  Service: Endoscopy;;   CHOLECYSTECTOMY N/A 07/19/2017   Procedure: LAPAROSCOPIC CHOLECYSTECTOMY WITH INTRAOPERATIVE CHOLANGIOGRAM;  Surgeon: Almond Lint, MD;  Location: [facility];  Service: General;  Laterality: N/A;   COLONOSCOPY WITH PROPOFOL N/A 05/24/2022   Procedure: COLONOSCOPY WITH PROPOFOL;  Surgeon: Lanelle Bal, DO;  [facility];  Service: Endoscopy;  Laterality: N/A;  7:30am, asa 2   COLONOSCOPY WITH PROPOFOL N/A 07/31/2023   Procedure: COLONOSCOPY  WITH PROPOFOL;  Surgeon: Lanelle Bal, DO;  [facility];  Service: Endoscopy;  Laterality: N/A;  930am, asa 2   POLYPECTOMY  05/24/2022   Procedure: POLYPECTOMY;  Surgeon: Lanelle Bal, DO;  [facility];  Service: Endoscopy;;   POLYPECTOMY  07/31/2023   Procedure: POLYPECTOMY;  Surgeon: Lanelle Bal, DO;  [facility];  Service: Endoscopy;;   TMJ ARTHROPLASTY  1995   Family History  Problem Relation Age of Onset   Heart disease Father    Stroke Neg Hx    Migraines Neg Hx    Neuropathy Neg Hx    Colon cancer Neg Hx    Pancreatic cancer Neg Hx    Celiac disease Neg Hx    Ulcers Neg Hx    Inflammatory bowel disease Neg Hx    Colon polyps Neg Hx    Breast cancer Neg Hx     Current Outpatient Medications:    ALPRAZolam (XANAX) 0.5 MG tablet, Take 1 tablet (0.5 mg total) by mouth at bedtime as needed for anxiety (travel)., Disp: 30 tablet, Rfl: 1   cetirizine (ZYRTEC) 10 MG tablet, Take 10 mg by mouth daily., Disp: , Rfl:    citalopram (CELEXA) 20 MG tablet, Take 1 tablet (20 mg total) by mouth daily., Disp: 90 tablet, Rfl: 3   hyoscyamine (LEVSIN SL) 0.125 MG SL tablet, Place 1 tablet (0.125 mg total) under the tongue every 4 (four) hours as needed. For cramping. Stop if constipation, Disp: 30 tablet, Rfl: 0   albuterol (PROAIR HFA) 108 (90 Base) MCG/ACT inhaler, Inhale 2 puffs into the lungs every 6 (six) hours as needed for wheezing or shortness of breath., Disp: 6.7 g, Rfl: 0  Allergies  Allergen Reactions   Tylenol [Acetaminophen] Anaphylaxis and Other (See Comments)    Wheezing/hives   Levofloxacin Other (See Comments)    Muscle spasms     ROS: Review of Systems A comprehensive review of systems was negative except for: occasional breast tenderness if cat lays on chest, skin spot on flank (stable), occ diarrhea (recently dx crohns)    Physical exam BP 117/67   Pulse (!) 102   Temp 98.8 F (37.1 C)   Ht 5\' 4"  (1.626 m)    Wt 114 lb (51.7 kg)   SpO2 99%   BMI 19.57 kg/m  General appearance: alert, cooperative, appears stated age, and no distress Head: Normocephalic, without obvious abnormality, atraumatic Eyes: negative findings: lids and lashes normal, conjunctivae and sclerae normal, corneas clear, and pupils equal, round, reactive to light and accomodation Ears: normal TM's and external ear canals both ears Nose: Nares normal. Septum midline. Mucosa normal. No drainage or sinus tenderness. Throat: lips, mucosa, and tongue normal; teeth and gums normal Neck: no adenopathy, supple, symmetrical, trachea midline, and thyroid not enlarged, symmetric, no tenderness/mass/nodules Back: symmetric, no curvature. ROM normal. No CVA tenderness. Lungs: clear to auscultation bilaterally Heart: regular rate and rhythm, S1, S2 normal, no murmur, click, rub or gallop Abdomen:  soft, non-tender; bowel sounds normal; no masses,  no organomegaly Extremities: extremities normal, atraumatic, no cyanosis or edema Pulses: 2+ and symmetric Skin:  Seborrheic keratoses noted along the bra line Lymph nodes: Cervical, supraclavicular, and axillary nodes normal. Neurologic: Grossly normal      08/24/2023   11:49 AM 06/27/2023    8:49 AM 07/05/2022   10:26 AM  Depression screen PHQ 2/9  Decreased Interest 0 0 0  Down, Depressed, Hopeless 0 0 0  PHQ - 2 Score 0 0 0  Altered sleeping 0 0 0  Tired, decreased energy 0 0 0  Change in appetite 0 0 0  Feeling bad or failure about yourself  0 0 0  Trouble concentrating 0 0 0  Moving slowly or fidgety/restless 0 0 0  Suicidal thoughts 0 0 0  PHQ-9 Score 0 0 0  Difficult doing work/chores  Not difficult at all Not difficult at all      08/24/2023   11:49 AM 06/27/2023    8:47 AM 07/05/2022   10:26 AM 03/17/2022   10:28 AM  GAD 7 : Generalized Anxiety Score  Nervous, Anxious, on Edge 0 0 0 0  Control/stop worrying 0 0 0 0  Worry too much - different things 0 0 0 0  Trouble  relaxing 0 0 0 0  Restless 0 0 0 0  Easily annoyed or irritable 0 0 0 0  Afraid - awful might happen 0 0 0 0  Total GAD 7 Score 0 0 0 0  Anxiety Difficulty   Not difficult at all Not difficult at all     Assessment/ Plan: Gerlene Fee here for annual physical exam.   Annual physical exam  Chronic use of benzodiazepine for therapeutic purpose  Crohn's disease of colon without complication (HCC)  Pure hypercholesterolemia - Plan: Lipid panel  Bronchospasm - Plan: albuterol (PROAIR HFA) 108 (90 Base) MCG/ACT inhaler  Up-to-date on UDS and CSC.  Has benzodiazepine on hand if needed.  May follow-up in 6 months if needs refills via video visit.  National narcotic database reviewed and there were no red flags  Suspect the gastroenterologist plans for immuno modulators.  Discussed okay to have labs drawn here if that is more convenient for the patient  Fasting lipid ordered.  She may need to collect this along with any other labs that her gastroenterologist deems necessary  Albuterol renewed as needed use  Counseled on healthy lifestyle choices, including diet (rich in fruits, vegetables and lean meats and low in salt and simple carbohydrates) and exercise (at least 30 minutes of moderate physical activity daily).  Patient to follow up 6-12 months  Corban Kistler M. Nadine Counts, DO

## 2023-08-28 ENCOUNTER — Ambulatory Visit: Payer: 59 | Admitting: Family Medicine

## 2023-08-28 ENCOUNTER — Other Ambulatory Visit (HOSPITAL_COMMUNITY): Payer: Self-pay

## 2023-08-30 ENCOUNTER — Ambulatory Visit: Payer: 59 | Admitting: Internal Medicine

## 2023-08-30 ENCOUNTER — Encounter: Payer: Self-pay | Admitting: Internal Medicine

## 2023-08-30 VITALS — BP 98/61 | HR 80 | Temp 98.4°F | Ht 64.0 in | Wt 116.4 lb

## 2023-08-30 DIAGNOSIS — K5010 Crohn's disease of large intestine without complications: Secondary | ICD-10-CM

## 2023-08-30 DIAGNOSIS — K529 Noninfective gastroenteritis and colitis, unspecified: Secondary | ICD-10-CM

## 2023-08-30 NOTE — Patient Instructions (Signed)
We will start the process of getting you on Entyvio at infusion center in [place].  You will need loading dose 0 in 2 weeks and 6 weeks and then every 8 weeks thereafter.  Will need to check blood work approximately 1 month after starting this medication.  It was very nice seeing you again today.  I hope you have a fantastic Christmas.  Dr. Marletta Lor

## 2023-08-30 NOTE — Progress Notes (Signed)
Referring Provider: Raliegh Ip, DO Primary Care Physician:  Raliegh Ip, DO Primary GI:  Dr. Marletta Lor  Chief Complaint  Patient presents with   Follow-up    Pt following up after procedure. Pt feeling ok today    HPI:   Joyce Hopkins is a 62 y.o. female who presents to clinic today for follow up visit. Complicated GI history.   She has a history of abnormal LFTs in the setting of acute illness/gallbladder sludge.  Concern for beaded appearance of the bile ducts.  Seen at [facility], suspicion that abnormal appearance of bile ducts due to acute illness and sludge. Autoimmune markers were negative.  December 2019 with normal LFTs.  It was recommended to hold off on repeat MRI/MRCP unless new signs or symptoms.  Normal colonoscopy in 2012 at outside facility.    In Feb 2022, she had a GI pathogen panel detected C. difficile toxin A/B but stand-alone C. difficile toxin A+B was negative.  She was treated with 10-day course of vancomycin given her presentation.  Her diarrhea resolved.   In June 2023: GI pathogen panel was negative.  Celiac serologies negative.  LFTs normal.  CBC normal.   CT abdomen pelvis July 2023: Segmental Lanpher thickening of the colon, most prominent from the cecum through the splenic flexure consistent with inflammatory or infectious colitis.   Colonoscopy September 2023: -Terminal ileum appeared normal. - Non-bleeding internal hemorrhoids. - Four 3 to 4 mm polyps in the ascending colon and in the cecum, removed with a cold snare. Resected and retrieved. - One 2 mm polyp in the transverse colon, removed with a cold biopsy forceps. Resected and retrieved. - Moderate inflammation was found in the cecum secondary to colitis. Biopsied. - Patchy mild inflammation was found in the descending colon, in the transverse colon and in the ascending colon secondary to colitis. Biopsied. - The examined portion of the ileum was normal. - One 5 mm polyp in the  transverse colon, removed with a cold snare. Resected and retrieved. -Biopsies from cecum showed chronic active colitis with crypt abscesses, negative for granulomas and dysplasia -Biopsies from ascending, transverse colon show evidence of focally active colitis with cryptitis.  Descending colon biopsy showing focally active colitis.  Sigmoid and rectum biopsies negative. Polyps removed were inflammatory.  Started on Budesonide in an effort to put her in remission. Completed 3 month course.   Does note 2-3 flares over the last 12 months for which she takes a few days azithromycin and her symptoms resolved.  Colonoscopy 07/31/2023 internal hemorrhoids, normal terminal ileum, inflammation with erosions erythema found in the descending colon transverse colon, ascending colon and the cecum.  Few polyps removed.  Pathology showed normal TI biopsies, biopsies of the ascending transverse descending colon with evidence of chronic inactive/quiescent colitis consistent with Crohn's disease.  Past Medical History:  Diagnosis Date   Allergy    Biliary colic    GERD (gastroesophageal reflux disease)     Past Surgical History:  Procedure Laterality Date   BIOPSY  05/24/2022   Procedure: BIOPSY;  Surgeon: Lanelle Bal, DO;  [facility];  Service: Endoscopy;;   BIOPSY  07/31/2023   Procedure: BIOPSY;  Surgeon: Lanelle Bal, DO;  [facility];  Service: Endoscopy;;   CHOLECYSTECTOMY N/A 07/19/2017   Procedure: LAPAROSCOPIC CHOLECYSTECTOMY WITH INTRAOPERATIVE CHOLANGIOGRAM;  Surgeon: Almond Lint, MD;  Location: [facility];  Service: General;  Laterality: N/A;   COLONOSCOPY WITH PROPOFOL N/A 05/24/2022  Procedure: COLONOSCOPY WITH PROPOFOL;  Surgeon: Lanelle Bal, DO;  [facility];  Service: Endoscopy;  Laterality: N/A;  7:30am, asa 2   COLONOSCOPY WITH PROPOFOL N/A 07/31/2023   Procedure: COLONOSCOPY WITH PROPOFOL;  Surgeon: Lanelle Bal, DO;  [facility];  Service: Endoscopy;  Laterality: N/A;  930am, asa 2   POLYPECTOMY  05/24/2022   Procedure: POLYPECTOMY;  Surgeon: Lanelle Bal, DO;  [facility];  Service: Endoscopy;;   POLYPECTOMY  07/31/2023   Procedure: POLYPECTOMY;  Surgeon: Lanelle Bal, DO;  [facility];  Service: Endoscopy;;   TMJ ARTHROPLASTY  1995    Current Outpatient Medications  Medication Sig Dispense Refill   albuterol (PROAIR HFA) 108 (90 Base) MCG/ACT inhaler Inhale 2 puffs into the lungs every 6 (six) hours as needed for wheezing or shortness of breath. 6.7 g 0   ALPRAZolam (XANAX) 0.5 MG tablet Take 1 tablet (0.5 mg total) by mouth at bedtime as needed for anxiety (travel). 30 tablet 1   cetirizine (ZYRTEC) 10 MG tablet Take 10 mg by mouth daily.     citalopram (CELEXA) 20 MG tablet Take 1 tablet (20 mg total) by mouth daily. 90 tablet 3   hyoscyamine (LEVSIN SL) 0.125 MG SL tablet Place 1 tablet (0.125 mg total) under the tongue every 4 (four) hours as needed. For cramping. Stop if constipation 30 tablet 0   No current facility-administered medications for this visit.    Allergies as of 08/30/2023 - Review Complete 08/30/2023  Allergen Reaction Noted   Tylenol [acetaminophen] Anaphylaxis and Other (See Comments) 02/22/2016   Levofloxacin Other (See Comments) 11/02/2010    Family History  Problem Relation Age of Onset   Heart disease Father    Stroke Neg Hx    Migraines Neg Hx    Neuropathy Neg Hx    Colon cancer Neg Hx    Pancreatic cancer Neg Hx    Celiac disease Neg Hx    Ulcers Neg Hx    Inflammatory bowel disease Neg Hx    Colon polyps Neg Hx    Breast cancer Neg Hx     Social History   Socioeconomic History   Marital status: Married    Spouse name: Gerri Spore "wes"   Number of children: 2   Years of education: 16   Highest education level: Bachelor's degree (e.g., BA, AB, BS)  Occupational History   Occupation: part-time  Tobacco Use   Smoking  status: Never    Passive exposure: Never   Smokeless tobacco: Never  Vaping Use   Vaping status: Never Used  Substance and Sexual Activity   Alcohol use: Yes    Alcohol/week: 0.0 standard drinks of alcohol    Comment: a few times a week wine with supper   Drug use: No   Sexual activity: Yes  Other Topics Concern   Not on file  Social History Narrative   Lives with husband   Caffeine use: 1 code per day   Coffee daily (very little)   Social Drivers of Corporate investment banker Strain: Low Risk  (08/23/2023)   Overall Financial Resource Strain (CARDIA)    Difficulty of Paying Living Expenses: Not very hard  Food Insecurity: No Food Insecurity (08/23/2023)   Hunger Vital Sign    Worried About Running Out of Food in the Last Year: Never true    Ran Out of Food in the Last Year: Never true  Transportation Needs: No  Transportation Needs (08/23/2023)   PRAPARE - Administrator, Civil Service (Medical): No    Lack of Transportation (Non-Medical): No  Physical Activity: Sufficiently Active (08/23/2023)   Exercise Vital Sign    Days of Exercise per Week: 5 days    Minutes of Exercise per Session: 90 min  Stress: No Stress Concern Present (08/23/2023)   [employer] of Occupational Health - Occupational Stress Questionnaire    Feeling of Stress : Only a little  Social Connections: Socially Integrated (08/23/2023)   Social Connection and Isolation Panel [NHANES]    Frequency of Communication with Friends and Family: Three times a week    Frequency of Social Gatherings with Friends and Family: Twice a week    Attends Religious Services: More than 4 times per year    Active Member of [employer] or Organizations: Yes    Attends Engineer, structural: More than 4 times per year    Marital Status: Married    Subjective: Review of Systems  Constitutional:  Negative for chills and fever.  HENT:  Negative for congestion and hearing loss.   Eyes:  Negative for  blurred vision and double vision.  Respiratory:  Negative for cough and shortness of breath.   Cardiovascular:  Negative for chest pain and palpitations.  Gastrointestinal:  Positive for diarrhea. Negative for abdominal pain, blood in stool, constipation, heartburn, melena and vomiting.  Genitourinary:  Negative for dysuria and urgency.  Musculoskeletal:  Negative for joint pain and myalgias.  Skin:  Negative for itching and rash.  Neurological:  Negative for dizziness and headaches.  Psychiatric/Behavioral:  Negative for depression. The patient is not nervous/anxious.      Objective: BP 98/61   Pulse 80   Temp 98.4 F (36.9 C)   Ht 5\' 4"  (1.626 m)   Wt 116 lb 6.4 oz (52.8 kg)   BMI 19.98 kg/m  Physical Exam Constitutional:      Appearance: Normal appearance.  HENT:     Head: Normocephalic and atraumatic.  Eyes:     Extraocular Movements: Extraocular movements intact.     Conjunctiva/sclera: Conjunctivae normal.  Cardiovascular:     Rate and Rhythm: Normal rate and regular rhythm.  Pulmonary:     Effort: Pulmonary effort is normal.     Breath sounds: Normal breath sounds.  Abdominal:     General: Bowel sounds are normal.     Palpations: Abdomen is soft.  Musculoskeletal:        General: No swelling. Normal range of motion.     Cervical back: Normal range of motion and neck supple.  Skin:    General: Skin is warm and dry.     Coloration: Skin is not jaundiced.  Neurological:     General: No focal deficit present.     Mental Status: She is alert and oriented to person, place, and time.  Psychiatric:        Mood and Affect: Mood normal.        Behavior: Behavior normal.      Assessment: *Idiopathic inflammatory bowel disease-Crohn's colitis, moderate *Abdominal pain-resolved  Plan: Discussed patient's colonoscopy results in depth with her today.  Given the chronicity of her symptoms and biopsy results, I believe patient likely has underlying Crohn's  colitis.  No longer taking NSAIDs  Biopsy showed quiescent, inactive colitis though endoscopic findings certainly appear to be active disease.  Discussed in depth with patient today.  Goal is to get both clinical and endoscopic remission.  I think at this point we need to escalate therapy to biologic patient is agreeable.  Discussed options and she would like to try Entyvio.  Will start the process of getting this set up for her, she will need infusions 0, 2 and 6 weeks and every 8 weeks thereafter.  Check LFTs CBC 4 weeks into treatment.  Follow-up with me in 2 to 3 months.   08/30/2023 8:37 AM   Disclaimer: This note was dictated with voice recognition software. Similar sounding words can inadvertently be transcribed and may not be corrected upon review.

## 2023-08-31 ENCOUNTER — Telehealth: Payer: Self-pay

## 2023-08-31 NOTE — Telephone Encounter (Addendum)
Dr Marletta Lor  The pt will need to have a TB skin / lab testing before I can send in her information to [facility]. I looked through her medical records and immunizations and I did not see one. Please advise

## 2023-09-02 NOTE — Telephone Encounter (Signed)
Patient had blood work performed 08/03/2023 including negative QuantiFERON (TB) and negative viral hepatitis testing.  She is already received the shingles vaccination prior.  Thank you

## 2023-09-06 NOTE — Telephone Encounter (Signed)
Noted paperwork done just waiting on a signature to send off

## 2023-09-06 NOTE — Telephone Encounter (Signed)
Package is finished waiting on signature to be done Monday, Dec 30th when Marletta Lor comes back to the office.

## 2023-09-11 NOTE — Telephone Encounter (Signed)
Hey Dr Marletta Lor,  The pt's paperwork is in the blue folder on your desk. I will fax when we return on Thursday.

## 2023-09-24 ENCOUNTER — Encounter: Payer: Self-pay | Admitting: Podiatry

## 2023-09-24 ENCOUNTER — Ambulatory Visit (INDEPENDENT_AMBULATORY_CARE_PROVIDER_SITE_OTHER): Payer: 59 | Admitting: Podiatry

## 2023-09-24 DIAGNOSIS — M7752 Other enthesopathy of left foot: Secondary | ICD-10-CM

## 2023-09-24 DIAGNOSIS — Q7033 Webbed toes, bilateral: Secondary | ICD-10-CM | POA: Diagnosis not present

## 2023-09-24 MED ORDER — BETAMETHASONE SOD PHOS & ACET 6 (3-3) MG/ML IJ SUSP
3.0000 mg | Freq: Once | INTRAMUSCULAR | Status: AC
  Administered 2023-09-24: 3 mg via INTRA_ARTICULAR

## 2023-09-24 NOTE — Progress Notes (Signed)
 Chief Complaint  Patient presents with   Foot Pain    Flare of neuroma 2nd IDS left   I wore the wrong shoes and got this thing acting up again. I wanted to see if I could get another injection    HPI: 63 y.o. female presenting today for follow-up evaluation of a Morton's neuroma to the left foot.  Patient states that she was doing very well for the past year but she did wear a pair of tight fitting shoes which seem to exacerbate her pain to the Morton's neuroma of the left foot.  She is requesting cortisone injection today.  She says the cortisone injection she had about 1 year ago helped resolve her symptoms and pain  Past Medical History:  Diagnosis Date   Allergy    Biliary colic    GERD (gastroesophageal reflux disease)     Past Surgical History:  Procedure Laterality Date   BIOPSY  05/24/2022   Procedure: BIOPSY;  Surgeon: Cindie Carlin POUR, DO;  [facility];  Service: Endoscopy;;   BIOPSY  07/31/2023   Procedure: BIOPSY;  Surgeon: Cindie Carlin POUR, DO;  [facility];  Service: Endoscopy;;   CHOLECYSTECTOMY N/A 07/19/2017   Procedure: LAPAROSCOPIC CHOLECYSTECTOMY WITH INTRAOPERATIVE CHOLANGIOGRAM;  Surgeon: Aron Shoulders, MD;  [facility]: [facility];  Service: General;  Laterality: N/A;   COLONOSCOPY WITH PROPOFOL  N/A 05/24/2022   Procedure: COLONOSCOPY WITH PROPOFOL ;  Surgeon: Cindie Carlin POUR, DO;  [facility];  Service: Endoscopy;  Laterality: N/A;  7:30am, asa 2   COLONOSCOPY WITH PROPOFOL  N/A 07/31/2023   Procedure: COLONOSCOPY WITH PROPOFOL ;  Surgeon: Cindie Carlin POUR, DO;  [facility];  Service: Endoscopy;  Laterality: N/A;  930am, asa 2   POLYPECTOMY  05/24/2022   Procedure: POLYPECTOMY;  Surgeon: Cindie Carlin POUR, DO;  [facility];  Service: Endoscopy;;   POLYPECTOMY  07/31/2023   Procedure: POLYPECTOMY;  Surgeon: Cindie Carlin POUR, DO;  [facility];  Service: Endoscopy;;   TMJ ARTHROPLASTY  1995     Allergies  Allergen Reactions   Tylenol [Acetaminophen] Anaphylaxis and Other (See Comments)    Wheezing/hives   Levofloxacin Other (See Comments)    Muscle spasms     Physical Exam: General: The patient is alert and oriented x3 in no acute distress.  Dermatology: Skin is warm, dry and supple bilateral lower extremities. Negative for open lesions or macerations.  Vascular: Palpable pedal pulses bilaterally. Capillary refill within normal limits.  Negative for any significant edema or erythema  Neurological: Light touch and protective threshold grossly intact  Musculoskeletal Exam: Partial syndactyly noted second and third digits bilateral.  There continues to be some tenderness to palpation to the second intermetatarsal space left foot.  Pain with lateral compression of the metatarsal heads.  Findings consistent with Morton's neuroma   Assessment: 1.  Morton's neuroma second interspace left 2.  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 Patient evaluated.  2.  Injection of 0.5 cc Celestone  Soluspan injected into the second interspace left 3.  Continue [place] running shoes that are wide and do not constrict the toebox area or forefoot 4.  The patient has not dealt with this Morton's neuroma for several months now.  She has tried multiple conservative treatment modalities but the neuroma persist.   5.  Return to clinic as needed   Thresa EMERSON Sar, DPM [facility]  Dr.  Thresa EMERSON Sar, DPM    [address] [place], [place] [address]                Office [phone]  Fax [phone]

## 2023-09-28 ENCOUNTER — Other Ambulatory Visit (HOSPITAL_COMMUNITY): Payer: Self-pay

## 2023-10-03 ENCOUNTER — Ambulatory Visit: Payer: 59 | Admitting: Podiatry

## 2024-02-04 ENCOUNTER — Ambulatory Visit
Admission: RE | Admit: 2024-02-04 | Discharge: 2024-02-04 | Disposition: A | Discharge status: Home / Self Care | Source: Ambulatory Visit | Attending: Family Medicine | Admitting: Family Medicine

## 2024-02-04 VITALS — BP 90/59 | HR 94 | Temp 98.4°F | Resp 16

## 2024-02-04 DIAGNOSIS — J101 Influenza due to other identified influenza virus with other respiratory manifestations: Secondary | ICD-10-CM

## 2024-02-04 LAB — POC COVID19/FLU A&B COMBO
Covid Antigen, POC: NEGATIVE
Influenza A Antigen, POC: POSITIVE — AB
Influenza B Antigen, POC: NEGATIVE

## 2024-02-04 MED ORDER — PROMETHAZINE-DM 6.25-15 MG/5ML PO SYRP: 5.0000 mL | ORAL_SOLUTION | Freq: Four times a day (QID) | ORAL | 0 refills | Status: DC | PRN

## 2024-02-04 MED ORDER — OSELTAMIVIR PHOSPHATE 75 MG PO CAPS: 75.0000 mg | ORAL_CAPSULE | Freq: Two times a day (BID) | ORAL | 0 refills | Status: DC

## 2024-02-04 MED ORDER — ONDANSETRON 4 MG PO TBDP: 4.0000 mg | ORAL_TABLET | Freq: Three times a day (TID) | ORAL | 0 refills | Status: DC | PRN

## 2024-02-04 NOTE — ED Provider Notes (Signed)
 [facility]    CSN: [id number] Arrival date & time: 02/04/24  1251      History   Chief Complaint Chief Complaint  Patient presents with   Cough   Headache    HPI Joyce Hopkins is a 64 y.o. female.   Patient presenting today with 2-day history of congestion, sore throat, cough, fatigue, fever, body aches, headache, nausea, vomiting.  Denies chest pain, shortness of breath, abdominal pain, diarrhea, rashes.  So far trying Advil allergy and sinus with minimal relief.  Recently came off of a cruise to [place] .    Past Medical History:  Diagnosis Date   Allergy    Biliary colic    GERD (gastroesophageal reflux disease)     Patient Active Problem List   Diagnosis Date Noted   Crohn's disease of colon without complication (HCC) 08/24/2023   Chronic use of benzodiazepine for therapeutic purpose 08/24/2023   Abnormal colonoscopy 08/02/2022   Diarrhea 07/10/2019   Abdominal pain, epigastric 05/09/2017   Abnormal LFTs 05/09/2017   Gallbladder sludge 05/09/2017   Mild intermittent asthma without complication 07/17/2016   BPPV (benign paroxysmal positional vertigo) 03/19/2016   Generalized anxiety disorder 03/19/2016    Past Surgical History:  Procedure Laterality Date   BIOPSY  05/24/2022   Procedure: BIOPSY;  Surgeon: Vinetta Greening, DO;  [facility];  Service: Endoscopy;;   BIOPSY  07/31/2023   Procedure: BIOPSY;  Surgeon: Vinetta Greening, DO;  [facility];  Service: Endoscopy;;   CHOLECYSTECTOMY N/A 07/19/2017   Procedure: LAPAROSCOPIC CHOLECYSTECTOMY WITH INTRAOPERATIVE CHOLANGIOGRAM;  Surgeon: Lockie Rima, MD;  [facility];  Service: General;  Laterality: N/A;   COLONOSCOPY WITH PROPOFOL  N/A 05/24/2022   Procedure: COLONOSCOPY WITH PROPOFOL ;  Surgeon: Vinetta Greening, DO;  [facility];  Service: Endoscopy;  Laterality: N/A;  7:30am, asa 2   COLONOSCOPY WITH PROPOFOL  N/A 07/31/2023   Procedure: COLONOSCOPY WITH  PROPOFOL ;  Surgeon: Vinetta Greening, DO;  [facility];  Service: Endoscopy;  Laterality: N/A;  930am, asa 2   POLYPECTOMY  05/24/2022   Procedure: POLYPECTOMY;  Surgeon: Vinetta Greening, DO;  [facility];  Service: Endoscopy;;   POLYPECTOMY  07/31/2023   Procedure: POLYPECTOMY;  Surgeon: Vinetta Greening, DO;  [facility];  Service: Endoscopy;;   TMJ ARTHROPLASTY  1995    OB History   No obstetric history on file.      Home Medications    Prior to Admission medications   Medication Sig Start Date End Date Taking? Authorizing Provider  cetirizine (ZYRTEC) 10 MG tablet Take 10 mg by mouth daily.   Yes [provider]  citalopram  (CELEXA ) 20 MG tablet Take 1 tablet (20 mg total) by mouth daily. 06/27/23  Yes Gottschalk, Sharolyn Decant M, DO  ondansetron  (ZOFRAN -ODT) 4 MG disintegrating tablet Take 1 tablet (4 mg total) by mouth every 8 (eight) hours as needed for nausea or vomiting. 02/04/24  Yes Corbin Dess, PA-C  oseltamivir (TAMIFLU) 75 MG capsule Take 1 capsule (75 mg total) by mouth every 12 (twelve) hours. 02/04/24  Yes Corbin Dess, PA-C  promethazine -dextromethorphan (PROMETHAZINE -DM) 6.25-15 MG/5ML syrup Take 5 mLs by mouth 4 (four) times daily as needed. 02/04/24  Yes Corbin Dess, PA-C  albuterol  (PROAIR  HFA) 108 (90 Base) MCG/ACT inhaler Inhale 2 puffs into the lungs every 6 (six) hours as needed for wheezing or shortness of breath. 08/24/23   Eliodoro Guerin, DO  ALPRAZolam  (XANAX ) 0.5 MG tablet Take 1 tablet (0.5 mg total) by mouth at bedtime as needed for anxiety (travel). 06/27/23   Eliodoro Guerin, DO  hyoscyamine  (LEVSIN SL) 0.125 MG SL tablet Place 1 tablet (0.125 mg total) under the tongue every 4 (four) hours as needed. For cramping. Stop if constipation 10/27/20   Delman Ferns, NP    Family History Family History  Problem Relation Age of Onset   Heart disease Father    Stroke Neg Hx     Migraines Neg Hx    Neuropathy Neg Hx    Colon cancer Neg Hx    Pancreatic cancer Neg Hx    Celiac disease Neg Hx    Ulcers Neg Hx    Inflammatory bowel disease Neg Hx    Colon polyps Neg Hx    Breast cancer Neg Hx     Social History Social History   Tobacco Use   Smoking status: Never    Passive exposure: Never   Smokeless tobacco: Never  Vaping Use   Vaping status: Never Used  Substance Use Topics   Alcohol use: Yes    Alcohol/week: 0.0 standard drinks of alcohol    Comment: a few times a week wine with supper   Drug use: No     Allergies   Tylenol [acetaminophen] and Levofloxacin   Review of Systems Review of Systems Per HPI  Physical Exam Triage Vital Signs ED Triage Vitals  Encounter Vitals Group     BP 02/04/24 1337 (!) 90/59     Systolic BP Percentile --      Diastolic BP Percentile --      Pulse Rate 02/04/24 1337 94     Resp 02/04/24 1337 16     Temp 02/04/24 1337 98.4 F (36.9 C)     Temp Source 02/04/24 1337 Oral     SpO2 02/04/24 1337 96 %     Weight --      Height --      Head Circumference --      Peak Flow --      Pain Score 02/04/24 1340 3     Pain Loc --      Pain Education --      Exclude from Growth Chart --    No data found.  Updated Vital Signs BP (!) 90/59 (BP Location: Right Arm)   Pulse 94   Temp 98.4 F (36.9 C) (Oral)   Resp 16   SpO2 96%   Visual Acuity Right Eye Distance:   Left Eye Distance:   Bilateral Distance:    Right Eye Near:   Left Eye Near:    Bilateral Near:     Physical Exam Vitals and nursing note reviewed.  Constitutional:      Appearance: Normal appearance.  HENT:     Head: Atraumatic.     Right Ear: Tympanic membrane and external ear normal.     Left Ear: Tympanic membrane and external ear normal.     Nose: Rhinorrhea present.     Mouth/Throat:     Mouth: Mucous membranes are moist.     Pharynx: Posterior oropharyngeal erythema present.  Eyes:     Extraocular Movements: Extraocular  movements intact.     Conjunctiva/sclera: Conjunctivae normal.  Cardiovascular:     Rate and Rhythm: Normal rate and regular rhythm.     Heart sounds: Normal heart sounds.  Pulmonary:     Effort: Pulmonary effort is normal.  Breath sounds: Normal breath sounds. No wheezing or rales.  Musculoskeletal:        General: Normal range of motion.     Cervical back: Normal range of motion and neck supple.  Skin:    General: Skin is warm and dry.  Neurological:     Mental Status: She is alert and oriented to person, place, and time.  Psychiatric:        Mood and Affect: Mood normal.        Thought Content: Thought content normal.      UC Treatments / Results  Labs (all labs ordered are listed, but only abnormal results are displayed) Labs Reviewed  POC COVID19/FLU A&B COMBO - Abnormal; Notable for the following components:      Result Value   Influenza A Antigen, POC Positive (*)    All other components within normal limits    EKG   Radiology No results found.  Procedures Procedures (including critical care time)  Medications Ordered in UC Medications - No data to display  Initial Impression / Assessment and Plan / UC Course  I have reviewed the triage vital signs and the nursing notes.  Pertinent labs & imaging results that were available during my care of the patient were reviewed by me and considered in my medical decision making (see chart for details).     Very mildly hypotensive in triage, otherwise vital signs normal.  She is well-appearing and in no acute distress.  Rapid flu positive for influenza A.  Treat with Tamiflu, supportive over-the-counter medications and home care.  Return for worsening symptoms.  Final Clinical Impressions(s) / UC Diagnoses   Final diagnoses:  Influenza A   Discharge Instructions   None    ED Prescriptions     Medication Sig Dispense Auth. Provider   oseltamivir (TAMIFLU) 75 MG capsule Take 1 capsule (75 mg total) by  mouth every 12 (twelve) hours. 10 capsule Corbin Dess, PA-C   ondansetron  (ZOFRAN -ODT) 4 MG disintegrating tablet Take 1 tablet (4 mg total) by mouth every 8 (eight) hours as needed for nausea or vomiting. 20 tablet Corbin Dess, PA-C   promethazine -dextromethorphan (PROMETHAZINE -DM) 6.25-15 MG/5ML syrup Take 5 mLs by mouth 4 (four) times daily as needed. 100 mL Corbin Dess, [place]      PDMP not reviewed this encounter.   Corbin Dess, [place] 02/04/24 1714

## 2024-02-04 NOTE — ED Triage Notes (Signed)
 Congestion, sore throat, cough, headache, vomiting x 2 days. Fever 101 last night. Taking Advil allergy and sinus.

## 2024-03-24 ENCOUNTER — Other Ambulatory Visit (HOSPITAL_COMMUNITY): Payer: Self-pay

## 2024-06-23 ENCOUNTER — Other Ambulatory Visit (HOSPITAL_COMMUNITY): Payer: Self-pay

## 2024-06-23 ENCOUNTER — Other Ambulatory Visit: Payer: Self-pay

## 2024-06-23 ENCOUNTER — Other Ambulatory Visit: Payer: Self-pay | Admitting: Family Medicine

## 2024-06-23 DIAGNOSIS — F411 Generalized anxiety disorder: Secondary | ICD-10-CM

## 2024-06-23 MED ORDER — CITALOPRAM HYDROBROMIDE 20 MG PO TABS
20.0000 mg | ORAL_TABLET | Freq: Every day | ORAL | 0 refills | Status: DC
  Filled 2024-06-23: qty 90, 90d supply, fill #0

## 2024-06-25 ENCOUNTER — Other Ambulatory Visit: Payer: Self-pay

## 2024-06-25 ENCOUNTER — Encounter: Payer: Self-pay | Admitting: Pharmacist

## 2024-06-25 ENCOUNTER — Other Ambulatory Visit (HOSPITAL_COMMUNITY): Payer: Self-pay

## 2024-06-26 ENCOUNTER — Other Ambulatory Visit (HOSPITAL_COMMUNITY): Payer: Self-pay

## 2024-07-02 ENCOUNTER — Other Ambulatory Visit: Payer: Self-pay | Admitting: Internal Medicine

## 2024-07-07 ENCOUNTER — Telehealth: Payer: Self-pay | Admitting: Internal Medicine

## 2024-07-07 NOTE — Telephone Encounter (Signed)
 Patient call in to make an appointment been 1 year since seen. Patient mentioned a medicine that Dr Cindie was going to try called Entyvio from last December.Patient was curious what happened to her trying this medication?

## 2024-07-07 NOTE — Telephone Encounter (Signed)
 Sent to Dr Cindie in secure chat as a FYI

## 2024-07-07 NOTE — Telephone Encounter (Signed)
 FYI:  Returned the pt's call and advised of what we will need to do because the paperwork was last noted on the Dr's desk but since it has been so long we will talk about it more at her next visit in December which she is glad because she is not sure if she wants to do the Entivyo. Pt is waiting until her ov to discuss things. I advised her that I would start on the paperwork and she stated no because she may not want to do it.

## 2024-07-23 NOTE — Telephone Encounter (Signed)
 error

## 2024-08-13 ENCOUNTER — Ambulatory Visit: Admitting: Internal Medicine

## 2024-08-13 ENCOUNTER — Encounter: Payer: Self-pay | Admitting: Internal Medicine

## 2024-08-13 VITALS — BP 101/65 | HR 71 | Temp 97.8°F | Ht 64.0 in | Wt 113.6 lb

## 2024-08-13 DIAGNOSIS — K5010 Crohn's disease of large intestine without complications: Secondary | ICD-10-CM

## 2024-08-13 DIAGNOSIS — R932 Abnormal findings on diagnostic imaging of liver and biliary tract: Secondary | ICD-10-CM | POA: Diagnosis not present

## 2024-08-13 MED ORDER — AZITHROMYCIN 500 MG PO TABS: 500.0000 mg | ORAL_TABLET | Freq: Every day | ORAL | 0 refills | Status: AC

## 2024-08-13 NOTE — Patient Instructions (Signed)
 We will continue to monitor your Crohn's disease for now.  I have sent in 14 pills of azithromycin  to have on hand as needed.  Let me know if your liver tests on annual physical are abnormal.  We may need to consider repeat imaging of your liver.  Follow-up in 6 months or sooner if needed.  It was very nice to seeing you again today.  Dr. Cindie

## 2024-08-13 NOTE — Progress Notes (Signed)
 Referring Provider: Jolinda Norene HERO, DO Primary Care Physician:  Jolinda Norene HERO, DO Primary GI:  Dr. Cindie  Chief Complaint  Patient presents with   Follow-up    Pt arrives for follow up. Pt reports doing well. No questions/concerns at this time    HPI:   Joyce Hopkins is a 63 y.o. female who presents to clinic today for follow up visit. Complicated GI history.   Abnormal imaging of liver: She has a history of abnormal LFTs in the setting of acute illness/gallbladder sludge.  Concern for beaded appearance of the bile ducts.  Seen at [facility], suspicion that abnormal appearance of bile ducts due to acute illness and sludge. Autoimmune markers were negative.  December 2019 with normal LFTs.  It was recommended to hold off on repeat MRI/MRCP unless new signs or symptoms.  Normal colonoscopy in 2012 at outside facility.  LFTs have remained normal since.  She is scheduled for annual physical next month with repeat blood work.   Crohn's colitis: Developed diarrhea in Feb 2022, she had a GI pathogen panel detected C. difficile toxin A/B but stand-alone C. difficile toxin A+B was negative.  She was treated with 10-day course of vancomycin  given her presentation.  Her diarrhea resolved.   In June 2023: GI pathogen panel was negative.  Celiac serologies negative.  LFTs normal.  CBC normal.   CT abdomen pelvis July 2023: Segmental Gonder thickening of the colon, most prominent from the cecum through the splenic flexure consistent with inflammatory or infectious colitis.   Colonoscopy September 2023: -Terminal ileum appeared normal. - Non-bleeding internal hemorrhoids. - Four 3 to 4 mm polyps in the ascending colon and in the cecum, removed with a cold snare. Resected and retrieved. - One 2 mm polyp in the transverse colon, removed with a cold biopsy forceps. Resected and retrieved. - Moderate inflammation was found in the cecum secondary to colitis. Biopsied. - Patchy mild inflammation  was found in the descending colon, in the transverse colon and in the ascending colon secondary to colitis. Biopsied. - The examined portion of the ileum was normal. - One 5 mm polyp in the transverse colon, removed with a cold snare. Resected and retrieved. -Biopsies from cecum showed chronic active colitis with crypt abscesses, negative for granulomas and dysplasia -Biopsies from ascending, transverse colon show evidence of focally active colitis with cryptitis.  Descending colon biopsy showing focally active colitis.  Sigmoid and rectum biopsies negative. Polyps removed were inflammatory.  Started on Budesonide  in an effort to put her in remission. Completed 3 month course.   Colonoscopy 07/31/2023 internal hemorrhoids, normal terminal ileum, inflammation with erosions erythema found in the descending colon transverse colon, ascending colon and the cecum.  Few polyps removed.  Pathology showed normal TI biopsies, biopsies of the ascending transverse descending colon with evidence of chronic inactive/quiescent colitis consistent with Crohn's disease.  Fecal calprotectin 08/07/2023 was 263.  On previous visit 08/30/23 (patient did not follow up as scheduled), we discussed escalating therapy to Entyvio which she was initially agreeable to.  She joined a Crohns support group and after reading up further on Entyvio she decided not to pursue this route.  Did not follow-up until today.  Today, states she had 1 flare of her symptoms in May 2025 for which she took 4 days of azithromycin .  No issues since.  Endorses normal bowel movements.  No diarrhea or constipation.  No mucus or rectal bleeding.  No abdominal pain.  Weight is stable.  Past Medical History:  Diagnosis Date   Allergy    Biliary colic    GERD (gastroesophageal reflux disease)     Past Surgical History:  Procedure Laterality Date   BIOPSY  05/24/2022   Procedure: BIOPSY;  Surgeon: Cindie Carlin POUR, DO;  [facility];   Service: Endoscopy;;   BIOPSY  07/31/2023   Procedure: BIOPSY;  Surgeon: Cindie Carlin POUR, DO;  [facility];  Service: Endoscopy;;   CHOLECYSTECTOMY N/A 07/19/2017   Procedure: LAPAROSCOPIC CHOLECYSTECTOMY WITH INTRAOPERATIVE CHOLANGIOGRAM;  Surgeon: Aron Shoulders, MD;  [facility];  Service: General;  Laterality: N/A;   COLONOSCOPY WITH PROPOFOL  N/A 05/24/2022   Procedure: COLONOSCOPY WITH PROPOFOL ;  Surgeon: Cindie Carlin POUR, DO;  [facility];  Service: Endoscopy;  Laterality: N/A;  7:30am, asa 2   COLONOSCOPY WITH PROPOFOL  N/A 07/31/2023   Procedure: COLONOSCOPY WITH PROPOFOL ;  Surgeon: Cindie Carlin POUR, DO;  [facility];  Service: Endoscopy;  Laterality: N/A;  930am, asa 2   POLYPECTOMY  05/24/2022   Procedure: POLYPECTOMY;  Surgeon: Cindie Carlin POUR, DO;  [facility];  Service: Endoscopy;;   POLYPECTOMY  07/31/2023   Procedure: POLYPECTOMY;  Surgeon: Cindie Carlin POUR, DO;  [facility];  Service: Endoscopy;;   TMJ ARTHROPLASTY  1995    Current Outpatient Medications  Medication Sig Dispense Refill   albuterol  (PROAIR  HFA) 108 (90 Base) MCG/ACT inhaler Inhale 2 puffs into the lungs every 6 (six) hours as needed for wheezing or shortness of breath. 6.7 g 0   ALPRAZolam  (XANAX ) 0.5 MG tablet Take 1 tablet (0.5 mg total) by mouth at bedtime as needed for anxiety (travel). 30 tablet 1   cetirizine (ZYRTEC) 10 MG tablet Take 10 mg by mouth daily.     citalopram  (CELEXA ) 20 MG tablet Take 1 tablet (20 mg total) by mouth daily. 90 tablet 0   hyoscyamine  (LEVSIN  SL) 0.125 MG SL tablet Place 1 tablet (0.125 mg total) under the tongue every 4 (four) hours as needed. For cramping. Stop if constipation 30 tablet 0   No current facility-administered medications for this visit.    Allergies as of 08/13/2024 - Review Complete 08/13/2024  Allergen Reaction Noted   Tylenol [acetaminophen] Anaphylaxis and Other (See Comments) 02/22/2016    Levofloxacin Other (See Comments) 11/02/2010    Family History  Problem Relation Age of Onset   Heart disease Father    Stroke Neg Hx    Migraines Neg Hx    Neuropathy Neg Hx    Colon cancer Neg Hx    Pancreatic cancer Neg Hx    Celiac disease Neg Hx    Ulcers Neg Hx    Inflammatory bowel disease Neg Hx    Colon polyps Neg Hx    Breast cancer Neg Hx     Social History   Socioeconomic History   Marital status: Married    Spouse name: Darryle foy   Number of children: 2   Years of education: 16   Highest education level: Bachelor's degree (e.g., BA, AB, BS)  Occupational History   Occupation: part-time  Tobacco Use   Smoking status: Never    Passive exposure: Never   Smokeless tobacco: Never  Vaping Use   Vaping status: Never Used  Substance and Sexual Activity   Alcohol use: Yes    Alcohol/week: 0.0 standard drinks of alcohol    Comment: a few times a week wine with supper   Drug use:  No   Sexual activity: Yes  Other Topics Concern   Not on file  Social History Narrative   Lives with husband   Caffeine use: 1 code per day   Coffee daily (very little)   Social Drivers of Corporate Investment Banker Strain: Low Risk  (08/23/2023)   Overall Financial Resource Strain (CARDIA)    Difficulty of Paying Living Expenses: Not very hard  Food Insecurity: No Food Insecurity (08/23/2023)   Hunger Vital Sign    Worried About Running Out of Food in the Last Year: Never true    Ran Out of Food in the Last Year: Never true  Transportation Needs: No Transportation Needs (08/23/2023)   PRAPARE - Administrator, Civil Service (Medical): No    Lack of Transportation (Non-Medical): No  Physical Activity: Sufficiently Active (08/23/2023)   Exercise Vital Sign    Days of Exercise per Week: 5 days    Minutes of Exercise per Session: 90 min  Stress: No Stress Concern Present (08/23/2023)   [employer] of Occupational Health - Occupational Stress  Questionnaire    Feeling of Stress : Only a little  Social Connections: Socially Integrated (08/23/2023)   Social Connection and Isolation Panel    Frequency of Communication with Friends and Family: Three times a week    Frequency of Social Gatherings with Friends and Family: Twice a week    Attends Religious Services: More than 4 times per year    Active Member of [employer] or Organizations: Yes    Attends Engineer, Structural: More than 4 times per year    Marital Status: Married    Subjective: Review of Systems  Constitutional:  Negative for chills and fever.  HENT:  Negative for congestion and hearing loss.   Eyes:  Negative for blurred vision and double vision.  Respiratory:  Negative for cough and shortness of breath.   Cardiovascular:  Negative for chest pain and palpitations.  Gastrointestinal:  Positive for diarrhea. Negative for abdominal pain, blood in stool, constipation, heartburn, melena and vomiting.  Genitourinary:  Negative for dysuria and urgency.  Musculoskeletal:  Negative for joint pain and myalgias.  Skin:  Negative for itching and rash.  Neurological:  Negative for dizziness and headaches.  Psychiatric/Behavioral:  Negative for depression. The patient is not nervous/anxious.      Objective: BP 101/65   Pulse 71   Temp 97.8 F (36.6 C)   Ht 5' 4 (1.626 m)   Wt 113 lb 9.6 oz (51.5 kg)   BMI 19.50 kg/m  Physical Exam Constitutional:      Appearance: Normal appearance.  HENT:     Head: Normocephalic and atraumatic.  Eyes:     Extraocular Movements: Extraocular movements intact.     Conjunctiva/sclera: Conjunctivae normal.  Cardiovascular:     Rate and Rhythm: Normal rate and regular rhythm.  Pulmonary:     Effort: Pulmonary effort is normal.     Breath sounds: Normal breath sounds.  Abdominal:     General: Bowel sounds are normal.     Palpations: Abdomen is soft.  Musculoskeletal:        General: No swelling. Normal range of motion.      Cervical back: Normal range of motion and neck supple.  Skin:    General: Skin is warm and dry.     Coloration: Skin is not jaundiced.  Neurological:     General: No focal deficit present.     Mental Status: She  is alert and oriented to person, place, and time.  Psychiatric:        Mood and Affect: Mood normal.        Behavior: Behavior normal.      Assessment/Plan:  1.  Idiopathic inflammatory bowel disease-Crohn's colitis, moderate. Discussed patient's colonoscopy results in depth with her today.  Given the chronicity of her symptoms and biopsy results, I believe patient likely has underlying Crohn's colitis.  No longer taking NSAIDs  Biopsy showed quiescent, inactive colitis though endoscopic findings certainly appear to be active disease.  Discussed in depth with patient today.  Goal is to get both clinical and endoscopic remission.  We previously discussed escalation to biologic which patient was agreeable to at the time but has now reconsidered after joining a Crohn's support group.  She would like to continue to monitor for now.  I counseled on the risk of untreated Crohn's including flares of her symptoms, possible stricture formation, fistulizing disease, risk of cancer due to uncontrolled inflammation.  She understands and is agreeable.  2.  Abnormal imaging of liver-LFTs are remained normal since previous evaluation at [facility].  Continue to monitor.  She is scheduled for blood work next month with her PCP.  If LFTs elevated may need to consider repeat MRI/MRCP.  Otherwise follow-up in 6 months.  08/13/2024 10:00 AM   Disclaimer: This note was dictated with voice recognition software. Similar sounding words can inadvertently be transcribed and may not be corrected upon review.

## 2024-08-20 ENCOUNTER — Other Ambulatory Visit: Payer: Self-pay | Admitting: Medical Genetics

## 2024-08-26 ENCOUNTER — Ambulatory Visit: Payer: 59 | Admitting: Family Medicine

## 2024-08-26 ENCOUNTER — Other Ambulatory Visit (HOSPITAL_COMMUNITY): Payer: Self-pay

## 2024-08-26 ENCOUNTER — Encounter: Payer: Self-pay | Admitting: Family Medicine

## 2024-08-26 VITALS — BP 107/65 | HR 76 | Temp 97.9°F | Ht 64.0 in | Wt 113.0 lb

## 2024-08-26 DIAGNOSIS — E7800 Pure hypercholesterolemia, unspecified: Secondary | ICD-10-CM | POA: Diagnosis not present

## 2024-08-26 DIAGNOSIS — Z23 Encounter for immunization: Secondary | ICD-10-CM | POA: Diagnosis not present

## 2024-08-26 DIAGNOSIS — Z79899 Other long term (current) drug therapy: Secondary | ICD-10-CM | POA: Diagnosis not present

## 2024-08-26 DIAGNOSIS — F411 Generalized anxiety disorder: Secondary | ICD-10-CM

## 2024-08-26 DIAGNOSIS — Z0001 Encounter for general adult medical examination with abnormal findings: Secondary | ICD-10-CM | POA: Diagnosis not present

## 2024-08-26 DIAGNOSIS — J4520 Mild intermittent asthma, uncomplicated: Secondary | ICD-10-CM

## 2024-08-26 DIAGNOSIS — Z0000 Encounter for general adult medical examination without abnormal findings: Secondary | ICD-10-CM

## 2024-08-26 DIAGNOSIS — K5010 Crohn's disease of large intestine without complications: Secondary | ICD-10-CM

## 2024-08-26 DIAGNOSIS — F418 Other specified anxiety disorders: Secondary | ICD-10-CM | POA: Diagnosis not present

## 2024-08-26 MED ORDER — ALBUTEROL SULFATE HFA 108 (90 BASE) MCG/ACT IN AERS
2.0000 | INHALATION_SPRAY | Freq: Four times a day (QID) | RESPIRATORY_TRACT | 0 refills | Status: AC | PRN
  Filled 2024-08-26: qty 6.7, 25d supply, fill #0

## 2024-08-26 MED ORDER — CITALOPRAM HYDROBROMIDE 20 MG PO TABS
20.0000 mg | ORAL_TABLET | Freq: Every day | ORAL | 0 refills | Status: AC
  Filled 2024-08-26 – 2024-09-22 (×2): qty 90, 90d supply, fill #0

## 2024-08-26 MED ORDER — ALPRAZOLAM 0.5 MG PO TABS
0.5000 mg | ORAL_TABLET | Freq: Every evening | ORAL | 1 refills | Status: AC | PRN
  Filled 2024-08-26: qty 30, 30d supply, fill #0

## 2024-08-26 NOTE — Progress Notes (Signed)
 Joyce Hopkins is a 63 y.o. female presents to office today for annual physical exam examination.    Patient reports that she is doing really well.  She voices no concerns today.  She is under the close care of Dr. Cindie with gastroenterology for management of her Crohn's disease.  Denies any rectal bleeding or abdominal pain.  She has azithromycin  on hand if needed for flareups as well as hyoscyamine .  She reports good control of her breathing and has not needed her albuterol  at all but keeps it on hand if needed for emergencies  Continues to use her alprazolam  very sparingly and only for travel.  Reports an upcoming trip with her entire family.  She will be having 3 generations of family staying at a home in [place] for Christmas and she is extremely excited about this.   Occupation: retired from [employer], Marital status: married, Substance use: none Health Maintenance Due  Topic Date Due   HIV Screening  Never done   Pneumococcal Vaccine: 50+ Years (1 of 2 - PCV) Never done   COVID-19 Vaccine (4 - 2025-26 season) 05/12/2024   Mammogram  05/17/2024    Immunization History  Administered Date(s) Administered   Hepatitis A, Adult 05/07/2018, 11/07/2018   Influenza Inj Mdck Quad Pf 06/07/2018   Influenza Split 07/10/2013   Influenza,inj,Quad PF,6+ Mos 06/24/2016, 06/05/2017, 06/08/2020, 06/23/2021, 07/05/2022   Influenza,trivalent, recombinat, inj, PF 07/10/2013   Influenza-Unspecified 06/05/2017, 06/07/2018, 05/04/2023   MODERNA COVID-19 SARS-COV-2 PEDS BIVALENT BOOSTER 20yr-12yr 07/25/2022   Moderna Sars-Covid-2 Vaccination 12/05/2019, 01/07/2020   Tdap 01/09/2006, 06/23/2021   Zoster Recombinant(Shingrix ) 06/08/2020, 08/27/2020   Past Medical History:  Diagnosis Date   Allergy    Biliary colic    GERD (gastroesophageal reflux disease)    Social History   Socioeconomic History   Marital status: Married    Spouse name: Darryle foy   Number of children: 2   Years of  education: 16   Highest education level: Bachelor's degree (e.g., BA, AB, BS)  Occupational History   Occupation: part-time  Tobacco Use   Smoking status: Never    Passive exposure: Never   Smokeless tobacco: Never  Vaping Use   Vaping status: Never Used  Substance and Sexual Activity   Alcohol use: Yes    Alcohol/week: 0.0 standard drinks of alcohol    Comment: a few times a week wine with supper   Drug use: No   Sexual activity: Yes  Other Topics Concern   Not on file  Social History Narrative   Lives with husband   Caffeine use: 1 code per day   Coffee daily (very little)   Social Drivers of Health   Tobacco Use: Low Risk (08/13/2024)   Patient History    Smoking Tobacco Use: Never    Smokeless Tobacco Use: Never    Passive Exposure: Never  Financial Resource Strain: Low Risk (08/19/2024)   Overall Financial Resource Strain (CARDIA)    Difficulty of Paying Living Expenses: Not very hard  Food Insecurity: No Food Insecurity (08/19/2024)   Epic    Worried About Programme Researcher, Broadcasting/film/video in the Last Year: Never true    Ran Out of Food in the Last Year: Never true  Transportation Needs: No Transportation Needs (08/19/2024)   Epic    Lack of Transportation (Medical): No    Lack of Transportation (Non-Medical): No  Physical Activity: Sufficiently Active (08/19/2024)   Exercise Vital Sign    Days of Exercise per Week: 5  days    Minutes of Exercise per Session: 30 min  Stress: No Stress Concern Present (08/19/2024)   [employer] of Occupational Health - Occupational Stress Questionnaire    Feeling of Stress: Only a little  Social Connections: Socially Integrated (08/19/2024)   Social Connection and Isolation Panel    Frequency of Communication with Friends and Family: More than three times a week    Frequency of Social Gatherings with Friends and Family: Twice a week    Attends Religious Services: More than 4 times per year    Active Member of Clubs or Organizations: Yes     Attends Banker Meetings: More than 4 times per year    Marital Status: Married  Catering Manager Violence: Not on file  Depression (PHQ2-9): Low Risk (08/24/2023)   Depression (PHQ2-9)    PHQ-2 Score: 0  Alcohol Screen: Low Risk (08/19/2024)   Alcohol Screen    Last Alcohol Screening Score (AUDIT): 3  Housing: Low Risk (08/19/2024)   Epic    Unable to Pay for Housing in the Last Year: No    Number of Times Moved in the Last Year: 0    Homeless in the Last Year: No  Utilities: Not on file  Health Literacy: Not on file   Past Surgical History:  Procedure Laterality Date   BIOPSY  05/24/2022   Procedure: BIOPSY;  Surgeon: Cindie Carlin POUR, DO;  [facility];  Service: Endoscopy;;   BIOPSY  07/31/2023   Procedure: BIOPSY;  Surgeon: Cindie Carlin POUR, DO;  [facility];  Service: Endoscopy;;   CHOLECYSTECTOMY N/A 07/19/2017   Procedure: LAPAROSCOPIC CHOLECYSTECTOMY WITH INTRAOPERATIVE CHOLANGIOGRAM;  Surgeon: Aron Shoulders, MD;  Location: [facility];  Service: General;  Laterality: N/A;   COLONOSCOPY WITH PROPOFOL  N/A 05/24/2022   Procedure: COLONOSCOPY WITH PROPOFOL ;  Surgeon: Cindie Carlin POUR, DO;  [facility];  Service: Endoscopy;  Laterality: N/A;  7:30am, asa 2   COLONOSCOPY WITH PROPOFOL  N/A 07/31/2023   Procedure: COLONOSCOPY WITH PROPOFOL ;  Surgeon: Cindie Carlin POUR, DO;  [facility];  Service: Endoscopy;  Laterality: N/A;  930am, asa 2   POLYPECTOMY  05/24/2022   Procedure: POLYPECTOMY;  Surgeon: Cindie Carlin POUR, DO;  [facility];  Service: Endoscopy;;   POLYPECTOMY  07/31/2023   Procedure: POLYPECTOMY;  Surgeon: Cindie Carlin POUR, DO;  [facility];  Service: Endoscopy;;   TMJ ARTHROPLASTY  1995   Family History  Problem Relation Age of Onset   Heart disease Father    Stroke Neg Hx    Migraines Neg Hx    Neuropathy Neg Hx    Colon cancer Neg Hx    Pancreatic cancer Neg Hx    Celiac disease Neg  Hx    Ulcers Neg Hx    Inflammatory bowel disease Neg Hx    Colon polyps Neg Hx    Breast cancer Neg Hx    Current Medications[1]  Allergies[2]   ROS: Review of Systems Pertinent items noted in HPI and remainder of comprehensive ROS otherwise negative.    Physical exam BP 107/65   Pulse 76   Temp 97.9 F (36.6 C)   Ht 5' 4 (1.626 m)   Wt 113 lb (51.3 kg)   SpO2 98%   BMI 19.40 kg/m  General appearance: alert, cooperative, appears stated age, and no distress Head: Normocephalic, without obvious abnormality, atraumatic Eyes: negative findings: lids and lashes normal, conjunctivae and sclerae normal, corneas  clear, and pupils equal, round, reactive to light and accomodation Ears: normal TM's and external ear canals both ears Nose: Nares normal. Septum midline. Mucosa normal. No drainage or sinus tenderness. Throat: lips, mucosa, and tongue normal; teeth and gums normal Neck: no adenopathy, no carotid bruit, supple, symmetrical, trachea midline, and thyroid  not enlarged, symmetric, no tenderness/mass/nodules Back: symmetric, no curvature. ROM normal. No CVA tenderness. Lungs: clear to auscultation bilaterally Heart: regular rate and rhythm, S1, S2 normal, no murmur, click, rub or gallop Abdomen: soft, non-tender; bowel sounds normal; no masses,  no organomegaly Extremities: extremities normal, atraumatic, no cyanosis or edema Pulses: 2+ and symmetric Skin: Skin color, texture, turgor normal. No rashes or lesions Lymph nodes: No supraclavicular or anterior cervical lymph node enlargement Neurologic: Alert and oriented X 3, normal strength and tone. Normal symmetric reflexes. Normal coordination and gait      08/26/2024   10:36 AM 08/24/2023   12:13 PM  MMSE - Mini Mental State Exam  Orientation to time 5 5  Orientation to Place 5 5  Registration 3 3  Attention/ Calculation 5 5  Recall 3 3  Language- name 2 objects 2 2  Language- repeat 1 1  Language- follow 3 step  command 3 3  Language- read & follow direction 1 1  Write a sentence 1 1  Copy design 1 1  Total score 30 30       08/26/2024   10:36 AM 08/24/2023   11:49 AM 06/27/2023    8:49 AM  Depression screen PHQ 2/9  Decreased Interest 0 0 0  Down, Depressed, Hopeless 0 0 0  PHQ - 2 Score 0 0 0  Altered sleeping 0 0 0  Tired, decreased energy 0 0 0  Change in appetite 0 0 0  Feeling bad or failure about yourself  0 0 0  Trouble concentrating 0 0 0  Moving slowly or fidgety/restless 0 0 0  Suicidal thoughts 0 0 0  PHQ-9 Score 0 0  0   Difficult doing work/chores Not difficult at all  Not difficult at all     Data saved with a previous flowsheet row definition      08/26/2024   10:36 AM 08/24/2023   11:49 AM 06/27/2023    8:47 AM 07/05/2022   10:26 AM  GAD 7 : Generalized Anxiety Score  Nervous, Anxious, on Edge 1 0 0 0  Control/stop worrying 0 0 0 0  Worry too much - different things 0 0 0 0  Trouble relaxing 0 0 0 0  Restless 0 0 0 0  Easily annoyed or irritable 0 0 0 0  Afraid - awful might happen 0 0 0 0  Total GAD 7 Score 1 0 0 0  Anxiety Difficulty Not difficult at all   Not difficult at all    No results found for this or any previous visit (from the past 2160 hours).   Assessment/ Plan: Joyce Hopkins here for annual physical exam.   Annual physical exam  Crohn's disease of colon without complication (HCC) - Plan: CMP14+EGFR, CBC with Differential, VITAMIN D  25 Hydroxy (Vit-D Deficiency, Fractures), Vitamin B12  Situational anxiety - Plan: ALPRAZolam  (XANAX ) 0.5 MG tablet, ToxASSURE Select 13 (MW), Urine  Generalized anxiety disorder - Plan: citalopram  (CELEXA ) 20 MG tablet, TSH, ToxASSURE Select 13 (MW), Urine  Chronic use of benzodiazepine for therapeutic purpose  Pure hypercholesterolemia - Plan: CMP14+EGFR, Lipid Panel, TSH  Mild intermittent asthma without complication - Plan: albuterol  (PROAIR  HFA)  108 (90 Base) MCG/ACT inhaler   Patient doing well.   Pneumococcal vaccination updated today.  She will get mammogram scheduled at discharge today.  Fasting labs collected.  UDS and CSA were updated as per office policy for as needed use of alprazolam .  MMSE normal.  National narcotic database reviewed with no red flags  ProAir  renewed for as needed use.  Counseled on healthy lifestyle choices, including diet (rich in fruits, vegetables and lean meats and low in salt and simple carbohydrates) and exercise (at least 30 minutes of moderate physical activity daily).  Patient to follow up 1 year for CPE  Tekeisha Hakim M. Daneille Desilva, DO        [1]  Current Outpatient Medications:    albuterol  (PROAIR  HFA) 108 (90 Base) MCG/ACT inhaler, Inhale 2 puffs into the lungs every 6 (six) hours as needed for wheezing or shortness of breath., Disp: 6.7 g, Rfl: 0   ALPRAZolam  (XANAX ) 0.5 MG tablet, Take 1 tablet (0.5 mg total) by mouth at bedtime as needed for anxiety (travel)., Disp: 30 tablet, Rfl: 1   azithromycin  (ZITHROMAX ) 500 MG tablet, Take 1 tablet (500 mg total) by mouth daily for 14 days., Disp: 14 tablet, Rfl: 0   cetirizine (ZYRTEC) 10 MG tablet, Take 10 mg by mouth daily., Disp: , Rfl:    citalopram  (CELEXA ) 20 MG tablet, Take 1 tablet (20 mg total) by mouth daily., Disp: 90 tablet, Rfl: 0   hyoscyamine  (LEVSIN  SL) 0.125 MG SL tablet, Place 1 tablet (0.125 mg total) under the tongue every 4 (four) hours as needed. For cramping. Stop if constipation, Disp: 30 tablet, Rfl: 0 [2]  Allergies Allergen Reactions   Tylenol [Acetaminophen] Anaphylaxis and Other (See Comments)    Wheezing/hives   Levofloxacin Other (See Comments)    Muscle spasms

## 2024-08-26 NOTE — Addendum Note (Signed)
 Addended by: SHERRE SUZEN PARAS on: 08/26/2024 11:10 AM   Modules accepted: Orders

## 2024-08-26 NOTE — Patient Instructions (Addendum)
 Hope you have an amazing Christmas with your family!!!

## 2024-08-27 ENCOUNTER — Other Ambulatory Visit (HOSPITAL_COMMUNITY): Payer: Self-pay

## 2024-08-27 ENCOUNTER — Ambulatory Visit: Payer: Self-pay | Admitting: Family Medicine

## 2024-08-27 DIAGNOSIS — E7800 Pure hypercholesterolemia, unspecified: Secondary | ICD-10-CM

## 2024-08-27 DIAGNOSIS — Z8249 Family history of ischemic heart disease and other diseases of the circulatory system: Secondary | ICD-10-CM

## 2024-08-27 LAB — CBC WITH DIFFERENTIAL/PLATELET
Basophils Absolute: 0.1 x10E3/uL (ref 0.0–0.2)
Basos: 1 %
EOS (ABSOLUTE): 0.6 x10E3/uL — ABNORMAL HIGH (ref 0.0–0.4)
Eos: 8 %
Hematocrit: 38.9 % (ref 34.0–46.6)
Hemoglobin: 12.9 g/dL (ref 11.1–15.9)
Immature Grans (Abs): 0 x10E3/uL (ref 0.0–0.1)
Immature Granulocytes: 0 %
Lymphocytes Absolute: 2.2 x10E3/uL (ref 0.7–3.1)
Lymphs: 31 %
MCH: 29.9 pg (ref 26.6–33.0)
MCHC: 33.2 g/dL (ref 31.5–35.7)
MCV: 90 fL (ref 79–97)
Monocytes Absolute: 0.5 x10E3/uL (ref 0.1–0.9)
Monocytes: 8 %
Neutrophils Absolute: 3.7 x10E3/uL (ref 1.4–7.0)
Neutrophils: 52 %
Platelets: 250 x10E3/uL (ref 150–450)
RBC: 4.32 x10E6/uL (ref 3.77–5.28)
RDW: 12.5 % (ref 11.7–15.4)
WBC: 7.1 x10E3/uL (ref 3.4–10.8)

## 2024-08-27 LAB — CMP14+EGFR
ALT: 15 IU/L (ref 0–32)
AST: 23 IU/L (ref 0–40)
Albumin: 4.5 g/dL (ref 3.9–4.9)
Alkaline Phosphatase: 91 IU/L (ref 49–135)
BUN/Creatinine Ratio: 25 (ref 12–28)
BUN: 18 mg/dL (ref 8–27)
Bilirubin Total: 0.3 mg/dL (ref 0.0–1.2)
CO2: 22 mmol/L (ref 20–29)
Calcium: 9.7 mg/dL (ref 8.7–10.3)
Chloride: 102 mmol/L (ref 96–106)
Creatinine, Ser: 0.73 mg/dL (ref 0.57–1.00)
Globulin, Total: 2.5 g/dL (ref 1.5–4.5)
Glucose: 91 mg/dL (ref 70–99)
Potassium: 4.6 mmol/L (ref 3.5–5.2)
Sodium: 140 mmol/L (ref 134–144)
Total Protein: 7 g/dL (ref 6.0–8.5)
eGFR: 92 mL/min/1.73 (ref >59)

## 2024-08-27 LAB — LIPID PANEL
Chol/HDL Ratio: 4 ratio (ref 0.0–4.4)
Cholesterol, Total: 274 mg/dL — ABNORMAL HIGH (ref 100–199)
HDL: 69 mg/dL (ref >39)
LDL Chol Calc (NIH): 178 mg/dL — ABNORMAL HIGH (ref 0–99)
Triglycerides: 148 mg/dL (ref 0–149)
VLDL Cholesterol Cal: 27 mg/dL (ref 5–40)

## 2024-08-27 LAB — TSH: TSH: 1.71 u[IU]/mL (ref 0.450–4.500)

## 2024-08-27 LAB — VITAMIN B12: Vitamin B-12: 420 pg/mL (ref 232–1245)

## 2024-08-27 LAB — VITAMIN D 25 HYDROXY (VIT D DEFICIENCY, FRACTURES): Vit D, 25-Hydroxy: 28.4 ng/mL — ABNORMAL LOW (ref 30.0–100.0)

## 2024-08-30 LAB — TOXASSURE SELECT 13 (MW), URINE

## 2024-09-12 ENCOUNTER — Other Ambulatory Visit: Payer: Self-pay

## 2024-09-12 DIAGNOSIS — Z1231 Encounter for screening mammogram for malignant neoplasm of breast: Secondary | ICD-10-CM

## 2024-09-15 ENCOUNTER — Ambulatory Visit
Admission: RE | Admit: 2024-09-15 | Discharge: 2024-09-15 | Disposition: A | Discharge status: Home / Self Care | Source: Ambulatory Visit | Attending: Family Medicine | Admitting: Family Medicine

## 2024-09-15 DIAGNOSIS — Z1231 Encounter for screening mammogram for malignant neoplasm of breast: Secondary | ICD-10-CM

## 2024-09-16 ENCOUNTER — Other Ambulatory Visit (HOSPITAL_COMMUNITY)
Admission: RE | Admit: 2024-09-16 | Discharge: 2024-09-16 | Disposition: A | Discharge status: Home / Self Care | Payer: Self-pay | Source: Ambulatory Visit | Attending: Oncology | Admitting: Oncology

## 2024-09-16 NOTE — Telephone Encounter (Signed)
 Can you check on the CT for her?  This is super confusing but I don't even see where she is scheduled.

## 2024-09-22 ENCOUNTER — Other Ambulatory Visit (HOSPITAL_BASED_OUTPATIENT_CLINIC_OR_DEPARTMENT_OTHER): Payer: Self-pay

## 2024-09-22 ENCOUNTER — Other Ambulatory Visit: Payer: Self-pay

## 2024-09-22 ENCOUNTER — Other Ambulatory Visit (HOSPITAL_COMMUNITY): Payer: Self-pay

## 2024-09-26 LAB — GENECONNECT MOLECULAR SCREEN: Genetic Analysis Overall Interpretation: NEGATIVE

## 2024-10-03 ENCOUNTER — Ambulatory Visit (HOSPITAL_COMMUNITY)
Admission: RE | Admit: 2024-10-03 | Discharge: 2024-10-03 | Disposition: A | Payer: Self-pay | Source: Ambulatory Visit | Attending: Family Medicine

## 2024-10-03 DIAGNOSIS — E7800 Pure hypercholesterolemia, unspecified: Secondary | ICD-10-CM | POA: Insufficient documentation

## 2024-10-03 DIAGNOSIS — Z8249 Family history of ischemic heart disease and other diseases of the circulatory system: Secondary | ICD-10-CM | POA: Insufficient documentation

## 2024-10-08 ENCOUNTER — Other Ambulatory Visit: Payer: Self-pay | Admitting: Family Medicine

## 2024-10-08 ENCOUNTER — Ambulatory Visit: Payer: Self-pay | Admitting: Family Medicine

## 2024-10-08 ENCOUNTER — Other Ambulatory Visit (HOSPITAL_COMMUNITY): Payer: Self-pay

## 2024-10-08 DIAGNOSIS — Z8249 Family history of ischemic heart disease and other diseases of the circulatory system: Secondary | ICD-10-CM

## 2024-10-08 DIAGNOSIS — E7800 Pure hypercholesterolemia, unspecified: Secondary | ICD-10-CM

## 2024-10-08 MED ORDER — ROSUVASTATIN CALCIUM 10 MG PO TABS
10.0000 mg | ORAL_TABLET | Freq: Every day | ORAL | 3 refills | Status: AC
  Filled 2024-10-08: qty 90, 90d supply, fill #0

## 2024-10-09 ENCOUNTER — Encounter: Payer: Self-pay | Admitting: Family Medicine

## 2024-10-13 ENCOUNTER — Other Ambulatory Visit (HOSPITAL_COMMUNITY): Payer: Self-pay

## 2024-10-15 ENCOUNTER — Other Ambulatory Visit (HOSPITAL_COMMUNITY): Payer: Self-pay

## 2025-08-28 ENCOUNTER — Encounter: Admitting: Family Medicine
